# Patient Record
Sex: Male | Born: 1980 | Race: White | Hispanic: No | Marital: Married | Smoking: Current every day smoker
Health system: Southern US, Community
[De-identification: ages and names within clinical notes are randomized; demographics above are authoritative.]

## PROBLEM LIST (undated history)

## (undated) DIAGNOSIS — K219 Gastro-esophageal reflux disease without esophagitis: Secondary | ICD-10-CM

## (undated) DIAGNOSIS — L511 Stevens-Johnson syndrome: Secondary | ICD-10-CM

## (undated) HISTORY — DX: Gastro-esophageal reflux disease without esophagitis: K21.9

## (undated) HISTORY — PX: OTHER SURGICAL HISTORY: SHX169

## (undated) HISTORY — DX: Stevens-Johnson syndrome: L51.1

---
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
## 2004-12-31 ENCOUNTER — Ambulatory Visit: Payer: Self-pay | Admitting: Family Medicine

## 2006-03-11 ENCOUNTER — Emergency Department (HOSPITAL_COMMUNITY): Admission: EM | Admit: 2006-03-11 | Discharge: 2006-03-11 | Payer: Self-pay | Admitting: Emergency Medicine

## 2007-07-07 IMAGING — CR DG SINUSES COMPLETE 3+V
4 series · 4 of 4 positions shown · non-contrast
Comparison: None

CLINICAL DATA: sinus infection. Dizziness. Tenderness.

Sinuses, 4 view:

[[person_name]]
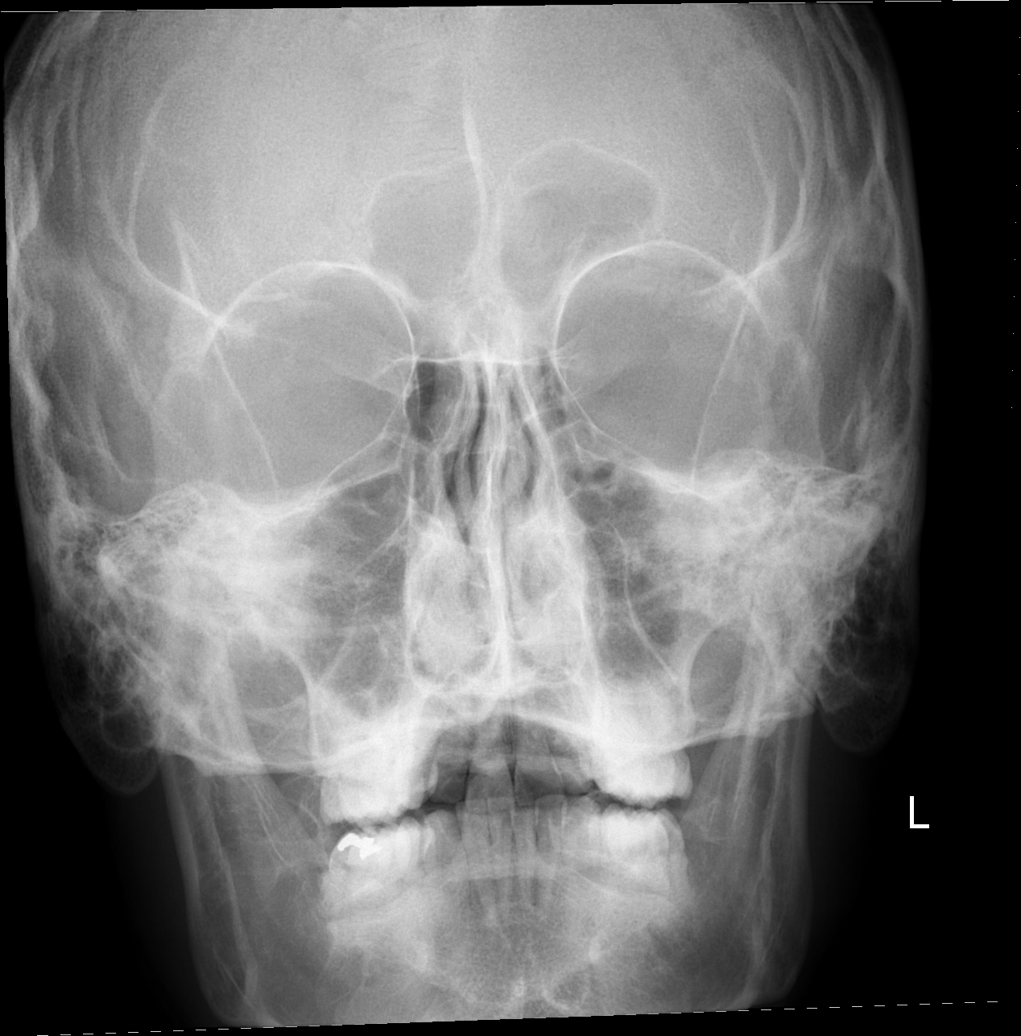

[w waters]
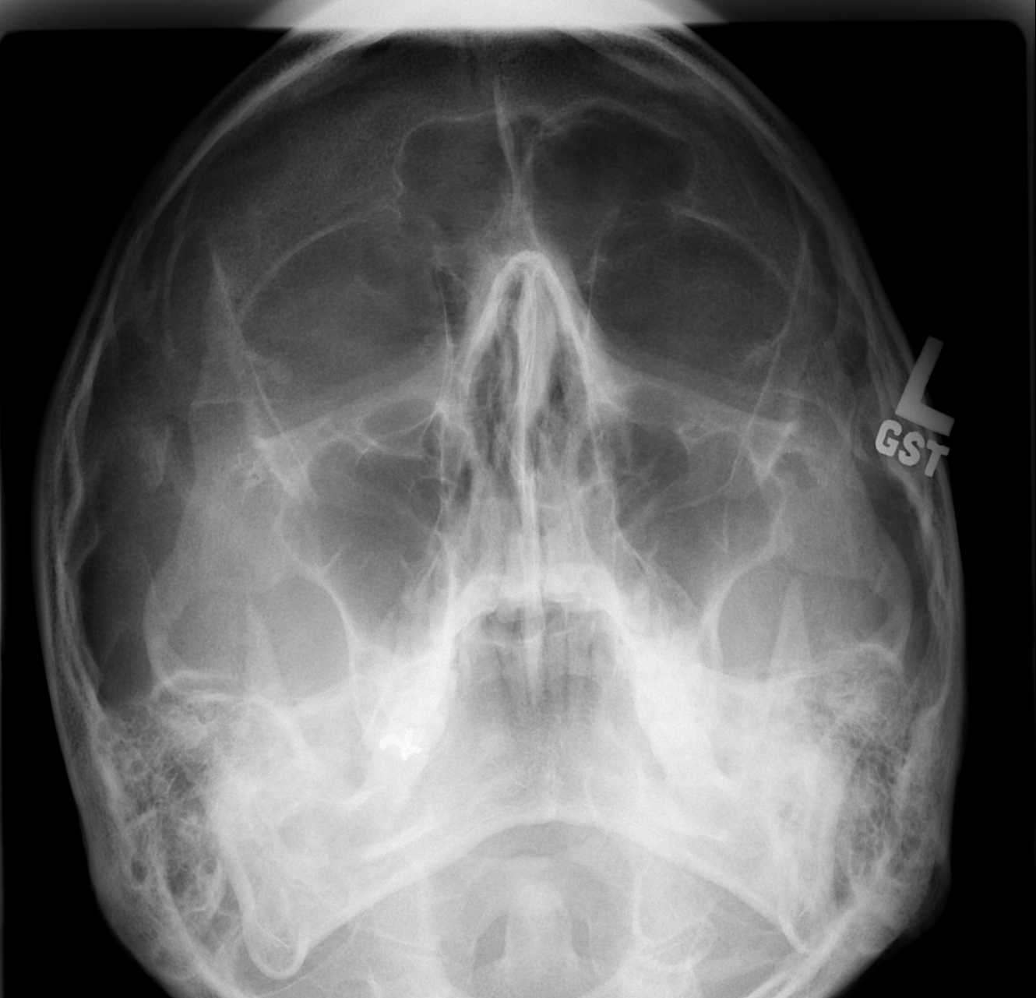

[w skull lat]
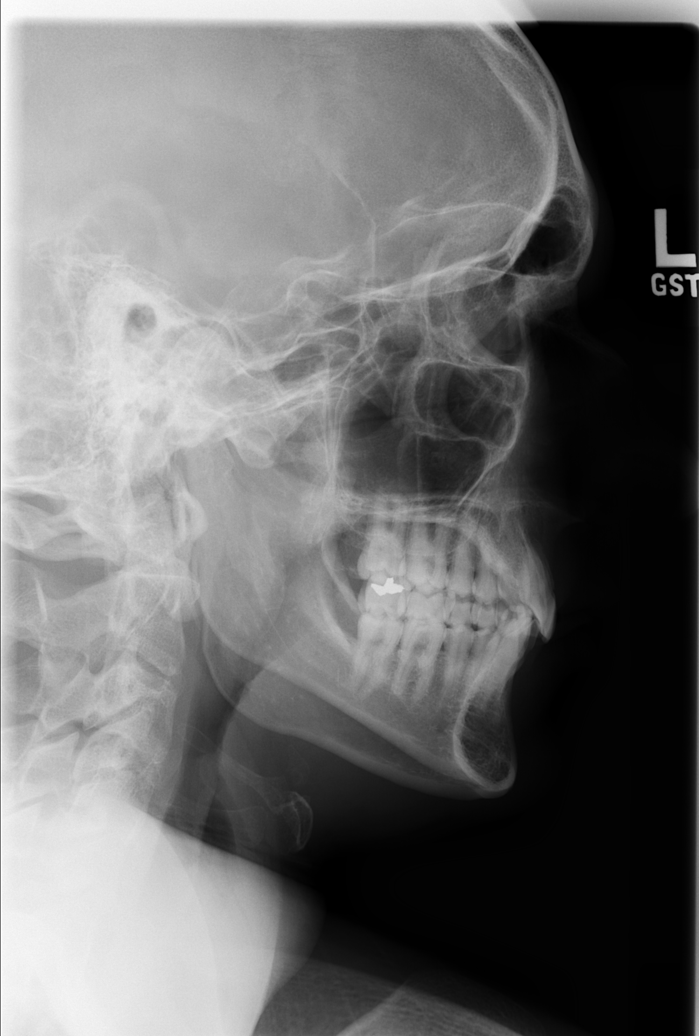

[w smv]
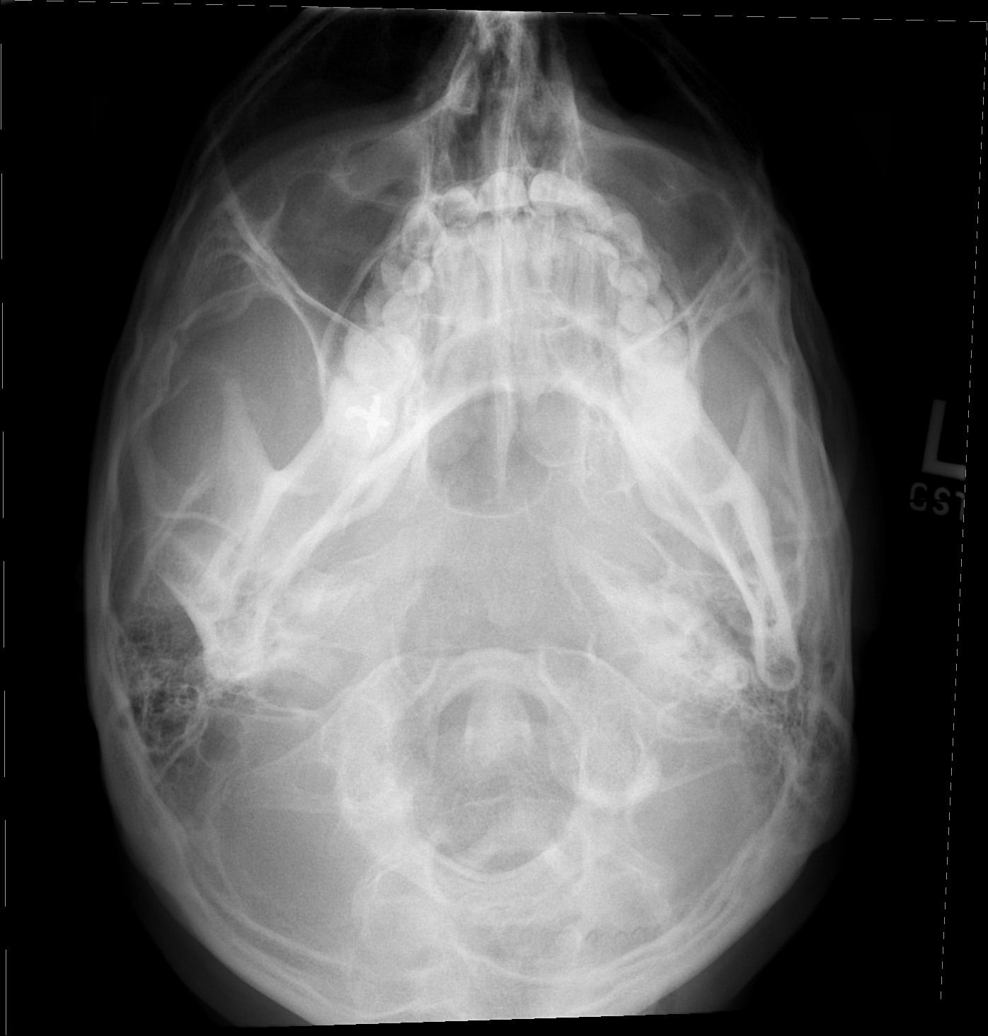

[4 of 4 positions shown; findings below may reference images not displayed]

FINDINGS: No definite paranasal sinus opacification is identified. CT scan is
more sensitive in detecting chronic sinusitis. No air-fluid level noted.

Impressions:
1. No sinusitis detected. CT scan offers greater diagnostic sensitivity.

## 2014-06-06 ENCOUNTER — Encounter: Payer: Self-pay | Admitting: Internal Medicine

## 2014-06-06 ENCOUNTER — Ambulatory Visit (INDEPENDENT_AMBULATORY_CARE_PROVIDER_SITE_OTHER): Payer: PRIVATE HEALTH INSURANCE | Admitting: Internal Medicine

## 2014-06-06 VITALS — BP 134/89 | HR 78 | Temp 97.8°F | Ht 72.0 in | Wt 195.0 lb

## 2014-06-06 DIAGNOSIS — L03113 Cellulitis of right upper limb: Secondary | ICD-10-CM | POA: Insufficient documentation

## 2014-06-06 MED ORDER — DOXYCYCLINE HYCLATE 100 MG PO TABS: 100.0000 mg | ORAL_TABLET | Freq: Two times a day (BID) | ORAL | Status: DC

## 2014-06-06 NOTE — Assessment & Plan Note (Addendum)
His cellulitis seems to be resolving after lancing it though with the pus it may be MRSA. Particularly with the significant lymphadenopathy. I will try a course of doxycycline to see if that it's the lymphadenopathy to start to resolve and see him in about 2 weeks. If his lymphadenopathy persists, or worsens, I may consider biopsy. Also will check HIV

## 2014-06-06 NOTE — Progress Notes (Signed)
   Subjective:    Patient ID: Chad Bray, male    DOB: Jan 06, 1981, 34 y.o.   MRN: [id number]  HPI This is a new pt referred for cellulitis.  Had right finger pain after foreign body into index finger from a broken ornament.  This occurred around 04/23/14.  Was seen by orthopedics on 05/19/14 and noted cellulitis.  Had xray with no foreign body.  No fever, no chills.  Started initially on 1/3 with Keflex TID for 10 days but did not resolve.  Noted axillary lymph nodes.  Saw ortho and continued Keflex at [facility].  It then developed into an abscess and he lanced it himself. White pus came out. It is now closed and healed. He will continues to have the right axillary lymph nodes which is been by his description stable in size and not decreasing, painful.   Review of Systems  Constitutional: Negative for fever, chills and fatigue.       Had fever but is now resolved, subjective  HENT: Negative for trouble swallowing.   Gastrointestinal: Negative for nausea and diarrhea.  Skin: Negative for rash.  Neurological: Negative for dizziness, light-headedness and headaches.       Objective:   Physical Exam  Constitutional: He appears well-developed and well-nourished. No distress.  Eyes: No scleral icterus.  Lymphadenopathy:    He has no cervical adenopathy.    He has axillary adenopathy.  He has about 1 cm axillary lymph node that is tender and fairly movable. No supra clavicular lymph nodes.          Assessment & Plan:

## 2014-06-07 ENCOUNTER — Telehealth: Payer: Self-pay | Admitting: *Deleted

## 2014-06-07 LAB — HIV ANTIBODY (ROUTINE TESTING W REFLEX): HIV 1&2 Ab, 4th Generation: NONREACTIVE

## 2014-06-07 NOTE — Telephone Encounter (Signed)
Pt notified. He will pick up doxycycline and start this today.  Andree CossHowell, Lenee Franze M, RN

## 2014-06-07 NOTE — Telephone Encounter (Signed)
-----   Message from Gardiner Barefootobert W Comer, MD sent at 06/07/2014 10:51 AM EST ----- Please let him know that his HIV test was negative.  thanks

## 2014-06-20 ENCOUNTER — Ambulatory Visit (INDEPENDENT_AMBULATORY_CARE_PROVIDER_SITE_OTHER): Payer: PRIVATE HEALTH INSURANCE | Admitting: Internal Medicine

## 2014-06-20 ENCOUNTER — Encounter: Payer: Self-pay | Admitting: Internal Medicine

## 2014-06-20 VITALS — BP 145/96 | HR 74 | Temp 98.4°F | Ht 72.0 in | Wt 194.0 lb

## 2014-06-20 DIAGNOSIS — L03113 Cellulitis of right upper limb: Secondary | ICD-10-CM

## 2014-06-20 NOTE — Assessment & Plan Note (Signed)
Cellulitis now resolved and his lymphadenopathy has nearly resolved. No indication for further antibiotics. He can return on a when necessary basis.

## 2014-06-20 NOTE — Progress Notes (Signed)
   Subjective:    Patient ID: Chad Bray, male    DOB: October 22, 1980, 34 y.o.   MRN: [id number]  HPI This is a follow-up for cellulitis.  Had right finger pain after foreign body into index finger from a broken ornament.  This occurred around 04/23/14.  Was seen by orthopedics on 05/19/14 and noted cellulitis.  Had xray with no foreign body.  No fever, no chills.  Started initially on 1/3 with Keflex TID for 10 days but did not resolve.  Noted axillary lymph nodes.  Saw ortho and continued Keflex at QID.  It then developed into an abscess and he lanced it himself. White pus came out. It then closed and healed. He continued to have the right axillary lymph nodes which is been by his description stable in size and not decreasing, painful when I saw him 2 weeks ago.  I then started him on doxycycline to take for 2 weeks. His lymphadenopathy now has nearly completely resolved. His right arm movement is now much improved and he feels much better.   Review of Systems  Constitutional: Negative for fever, chills and fatigue.  HENT: Negative for trouble swallowing.   Gastrointestinal: Negative for nausea and diarrhea.  Skin: Negative for rash.  Neurological: Negative for dizziness, light-headedness and headaches.       Objective:   Physical Exam  Constitutional: He appears well-developed and well-nourished. No distress.  Eyes: No scleral icterus.  Lymphadenopathy:    He has no cervical adenopathy.    He has axillary adenopathy.  He has about 2-3 mm axillary lymph node that is minimally tender, much improved.          Assessment & Plan:

## 2022-11-23 ENCOUNTER — Inpatient Hospital Stay (HOSPITAL_COMMUNITY)
Admission: EM | Admit: 2022-11-23 | Discharge: 2022-11-26 | DRG: 481 | Disposition: A | Discharge status: Home / Self Care | Payer: 59 | Attending: Orthopedic Surgery | Admitting: Orthopedic Surgery

## 2022-11-23 ENCOUNTER — Emergency Department (HOSPITAL_COMMUNITY): Payer: 59

## 2022-11-23 ENCOUNTER — Other Ambulatory Visit: Payer: Self-pay

## 2022-11-23 ENCOUNTER — Encounter (HOSPITAL_COMMUNITY): Payer: Self-pay

## 2022-11-23 DIAGNOSIS — Z7985 Long-term (current) use of injectable non-insulin antidiabetic drugs: Secondary | ICD-10-CM

## 2022-11-23 DIAGNOSIS — Z888 Allergy status to other drugs, medicaments and biological substances status: Secondary | ICD-10-CM

## 2022-11-23 DIAGNOSIS — M62838 Other muscle spasm: Secondary | ICD-10-CM | POA: Diagnosis present

## 2022-11-23 DIAGNOSIS — W1789XA Other fall from one level to another, initial encounter: Secondary | ICD-10-CM | POA: Diagnosis present

## 2022-11-23 DIAGNOSIS — E876 Hypokalemia: Secondary | ICD-10-CM | POA: Diagnosis present

## 2022-11-23 DIAGNOSIS — F17210 Nicotine dependence, cigarettes, uncomplicated: Secondary | ICD-10-CM | POA: Diagnosis present

## 2022-11-23 DIAGNOSIS — M25461 Effusion, right knee: Secondary | ICD-10-CM | POA: Diagnosis present

## 2022-11-23 DIAGNOSIS — S72301A Unspecified fracture of shaft of right femur, initial encounter for closed fracture: Secondary | ICD-10-CM | POA: Diagnosis present

## 2022-11-23 DIAGNOSIS — E871 Hypo-osmolality and hyponatremia: Secondary | ICD-10-CM | POA: Diagnosis not present

## 2022-11-23 DIAGNOSIS — D62 Acute posthemorrhagic anemia: Secondary | ICD-10-CM | POA: Diagnosis not present

## 2022-11-23 DIAGNOSIS — S72141A Displaced intertrochanteric fracture of right femur, initial encounter for closed fracture: Principal | ICD-10-CM | POA: Diagnosis present

## 2022-11-23 DIAGNOSIS — M898X9 Other specified disorders of bone, unspecified site: Secondary | ICD-10-CM | POA: Diagnosis present

## 2022-11-23 DIAGNOSIS — K219 Gastro-esophageal reflux disease without esophagitis: Secondary | ICD-10-CM | POA: Diagnosis present

## 2022-11-23 DIAGNOSIS — S72001A Fracture of unspecified part of neck of right femur, initial encounter for closed fracture: Secondary | ICD-10-CM | POA: Diagnosis not present

## 2022-11-23 LAB — BASIC METABOLIC PANEL
Anion gap: 14 (ref 5–15)
BUN: 14 mg/dL (ref 6–20)
CO2: 23 mmol/L (ref 22–32)
Calcium: 9.2 mg/dL (ref 8.9–10.3)
Chloride: 100 mmol/L (ref 98–111)
Creatinine, Ser: 1.02 mg/dL (ref 0.61–1.24)
GFR, Estimated: >60 mL/min (ref >60)
Glucose, Bld: 120 mg/dL — ABNORMAL HIGH (ref 70–99)
Potassium: 3.1 mmol/L — ABNORMAL LOW (ref 3.5–5.1)
Sodium: 137 mmol/L (ref 135–145)

## 2022-11-23 LAB — SURGICAL PCR SCREEN
MRSA, PCR: NEGATIVE
Staphylococcus aureus: NEGATIVE

## 2022-11-23 LAB — CBC
HCT: 43.5 % (ref 39.0–52.0)
Hemoglobin: 15.1 g/dL (ref 13.0–17.0)
MCH: 32.3 pg (ref 26.0–34.0)
MCHC: 34.7 g/dL (ref 30.0–36.0)
MCV: 92.9 fL (ref 80.0–100.0)
Platelets: 214 10*3/uL (ref 150–400)
RBC: 4.68 MIL/uL (ref 4.22–5.81)
RDW: 12.3 % (ref 11.5–15.5)
WBC: 8.7 10*3/uL (ref 4.0–10.5)
nRBC: 0 % (ref 0.0–0.2)

## 2022-11-23 LAB — HIV ANTIBODY (ROUTINE TESTING W REFLEX): HIV Screen 4th Generation wRfx: NONREACTIVE

## 2022-11-23 LAB — POTASSIUM: Potassium: 4 mmol/L (ref 3.5–5.1)

## 2022-11-23 MED ORDER — ACETAMINOPHEN 650 MG RE SUPP: 650.0000 mg | Freq: Four times a day (QID) | RECTAL | Status: DC | PRN

## 2022-11-23 MED ORDER — POTASSIUM CHLORIDE CRYS ER 20 MEQ PO TBCR
40.0000 meq | EXTENDED_RELEASE_TABLET | ORAL | Status: AC
  Administered 2022-11-23 (×2): 40 meq via ORAL
  Filled 2022-11-23 (×2): qty 2

## 2022-11-23 MED ORDER — HYDROMORPHONE HCL 1 MG/ML IJ SOLN
1.0000 mg | INTRAMUSCULAR | Status: DC | PRN
  Administered 2022-11-23 – 2022-11-25 (×17): 1 mg via INTRAVENOUS
  Filled 2022-11-23 (×17): qty 1

## 2022-11-23 MED ORDER — ONDANSETRON HCL 4 MG PO TABS: 4.0000 mg | ORAL_TABLET | Freq: Four times a day (QID) | ORAL | Status: DC | PRN

## 2022-11-23 MED ORDER — ENOXAPARIN SODIUM 40 MG/0.4ML IJ SOSY
40.0000 mg | PREFILLED_SYRINGE | Freq: Once | INTRAMUSCULAR | Status: AC
  Administered 2022-11-23: 40 mg via SUBCUTANEOUS

## 2022-11-23 MED ORDER — ENOXAPARIN SODIUM 40 MG/0.4ML IJ SOSY: 40.0000 mg | PREFILLED_SYRINGE | INTRAMUSCULAR | Status: DC

## 2022-11-23 MED ORDER — DIAZEPAM 5 MG/ML IJ SOLN
10.0000 mg | INTRAMUSCULAR | Status: DC | PRN
  Administered 2022-11-23: 10 mg via INTRAVENOUS
  Filled 2022-11-23: qty 2

## 2022-11-23 MED ORDER — HYDROMORPHONE HCL 1 MG/ML IJ SOLN
1.0000 mg | Freq: Once | INTRAMUSCULAR | Status: AC
  Administered 2022-11-23: 1 mg via INTRAVENOUS
  Filled 2022-11-23: qty 1

## 2022-11-23 MED ORDER — FENTANYL CITRATE PF 50 MCG/ML IJ SOSY
50.0000 ug | PREFILLED_SYRINGE | Freq: Once | INTRAMUSCULAR | Status: AC
  Administered 2022-11-23: 50 ug via INTRAVENOUS
  Filled 2022-11-23: qty 1

## 2022-11-23 MED ORDER — ONDANSETRON HCL 4 MG/2ML IJ SOLN: 4.0000 mg | Freq: Four times a day (QID) | INTRAMUSCULAR | Status: DC | PRN

## 2022-11-23 MED ORDER — ACETAMINOPHEN 325 MG PO TABS: 650.0000 mg | ORAL_TABLET | Freq: Four times a day (QID) | ORAL | Status: DC | PRN

## 2022-11-23 MED ORDER — OXYCODONE HCL 5 MG PO TABS
10.0000 mg | ORAL_TABLET | ORAL | Status: DC | PRN
  Administered 2022-11-23 – 2022-11-25 (×4): 10 mg via ORAL
  Filled 2022-11-23 (×5): qty 2

## 2022-11-23 MED ORDER — LACTATED RINGERS IV SOLN: INTRAVENOUS | Status: DC

## 2022-11-23 MED ORDER — METHOCARBAMOL 1000 MG/10ML IJ SOLN
500.0000 mg | Freq: Four times a day (QID) | INTRAVENOUS | Status: DC | PRN
  Administered 2022-11-23 (×2): 500 mg via INTRAVENOUS
  Filled 2022-11-23 (×2): qty 500

## 2022-11-23 NOTE — Consult Note (Signed)
ORTHOPAEDIC H and P  REQUESTING PHYSICIAN: Meredeth Ide, MD  PCP:  Patient, No Pcp Per  Chief Complaint: Right hip pain  HPI: Chad Bray is a 42 y.o. male who complains of right hip pain and deformity of the leg following a fall yesterday evening.  He was attempting to hop over a fence to greet a friend when he got his foot caught on the rail and landed on his right hip.  He had immediate pain and deformity.  He was assisted to his feet by some friends.  He knew that he could not apply much weight or pressure so presented to the ER where x-rays did show hip fracture.  This was at about 330 this morning.  Orthopedic surgery called for consultation.  [facility] is a established patient of [facility] thus we were consulted.  Denies numbness or tingling.  No history of previous surgery or medical morbidity.  He does smoke tobacco.  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  Past Medical History:  Diagnosis Date   GERD (gastroesophageal reflux disease)    Stevens-Johnson syndrome (HCC)    lamictal   History reviewed. No pertinent surgical history. Social History   Socioeconomic History   Marital status: Married    Spouse name: Not on file   Number of children: Not on file   Years of education: Not on file   Highest education level: Not on file  Occupational History   Not on file  Tobacco Use   Smoking status: Every Day    Current packs/day: 0.10    Average packs/day: 0.1 packs/day for 10.0 years (1.0 ttl pk-yrs)    Types: Cigarettes   Smokeless tobacco: Never  Substance and Sexual Activity   Alcohol use: Yes    Alcohol/week: 5.0 standard drinks of alcohol    Types: 5 Standard drinks or equivalent per week    Comment: social   Drug use: No   Sexual activity: Yes    Partners: Female  Other Topics Concern   Not on file  Social History Narrative   Not on file   Social Determinants of Health   Financial Resource Strain: Not on file  Food  Insecurity: Not on file  Transportation Needs: Not on file  Physical Activity: Not on file  Stress: Not on file  Social Connections: Not on file   History reviewed. No pertinent family history. Allergies  Allergen Reactions   Lamictal [Lamotrigine] Other (See Comments)    Levonne Spiller Syndrome, 2014   Prior to Admission medications   Medication Sig Start Date End Date Taking? Authorizing Provider  tirzepatide (ZEPBOUND) 2.5 MG/0.5ML Pen Inject 2.5 mg into the skin once a week. Patient not taking: Reported on 11/23/2022 07/10/22   [provider]   DG Femur Min 2 Views Right  Result Date: 11/23/2022 CLINICAL DATA:  Fall, landing on right hip. EXAM: RIGHT FEMUR 2 VIEWS COMPARISON:  None Available. FINDINGS: There are is a comminuted fracture involving the greater and lesser trochanters with extension into the proximal femoral diaphysis with medial displacement of the distal fracture fragment. No dislocation. The soft tissues are within normal limits. IMPRESSION: Comminuted intertrochanteric fracture extending into the proximal femoral diaphysis with medial displacement of the distal fracture fragment. Electronically Signed   By: Thornell Sartorius M.D.   On: 11/23/2022 02:23   DG Pelvis Portable  Result Date: 11/23/2022 CLINICAL DATA:  Fall, right hip pain EXAM: PORTABLE PELVIS 1-2 VIEWS COMPARISON:  None Available. FINDINGS: An acute minimally displaced intratrochanteric right hip fracture is seen with fracture fragments in grossly anatomic alignment on this limited examination. Pelvis and visualized proximal left femur appear intact. Soft tissues are unremarkable. IMPRESSION: 1. Acute minimally displaced intratrochanteric right hip fracture. Electronically Signed   By: Helyn Numbers M.D.   On: 11/23/2022 02:19    Positive ROS: All other systems have been reviewed and were otherwise negative with the exception of those mentioned in the HPI and as above.  Physical Exam: General: Alert,  no acute distress Cardiovascular: No pedal edema Respiratory: No cyanosis, no use of accessory musculature GI: No organomegaly, abdomen is soft and non-tender Skin: No lesions in the area of chief complaint Neurologic: Sensation intact distally Psychiatric: Patient is competent for consent with normal mood and affect Lymphatic: No axillary or cervical lymphadenopathy  MUSCULOSKELETAL:  Right lower extremity is flexed at the hip and knee and resting on a pillow.  Neurovascular intact.  Assessment: Right hip intertrochanteric fracture with subtrochanteric spiral extension  Plan: -Plan will be to place in Buck's traction today for comfort.  He can have a diet today.  Will need to be n.p.o. tonight at midnight.  -Will administer a one-time only dose of Lovenox today.  -Dr. Linna Caprice is consulted with Dr. Carola Frost and his team for operative management tomorrow on August 5.  I have conveyed that to the patient.  -Appreciate hospitalist service for medical management.  They are currently replating his hypokalemia.  -Otherwise, appreciate Dr. Carola Frost for definitive orthopedic trauma care.    Yolonda Kida, MD Cell [phone]    11/23/2022 9:37 AM

## 2022-11-23 NOTE — Plan of Care (Signed)

## 2022-11-23 NOTE — Progress Notes (Signed)
Consult request received for right femur fracture. Have discussed with the requesting MD patient's stability, pain control, and ongoing work up. I have reviewed x-rays and developed a provisional plan.   Full consultation to follow in the am.  Will obtain CT scan at some point before surgery tomorrow to assist with surgical planning given spiral pattern and comminution which involves the greater trochanter and may extend into the piriformis fossa also.  Myrene Galas, MD [facility], [facility] [phone]

## 2022-11-23 NOTE — H&P (Addendum)
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  Felt immediate pain in his right hip.  Brought to the ER.  X-rays show comminuted intertrochanteric fracture with medial displacement of the distal fragment.  EDP is consulted orthopedics.  [facility] hospitalist contacted for admission.  Patient denies any head trauma.  No chronic medical issues.    ED Course: xray show right intertrochanteric fracture.   Review of Systems:  Review of Systems  Constitutional: Negative.   HENT: Negative.    Eyes: Negative.   Respiratory: Negative.    Cardiovascular: Negative.   Gastrointestinal: Negative.   Genitourinary: Negative.   Musculoskeletal:        Right Hip pain  Skin: Negative.   Neurological: Negative.   Endo/Heme/Allergies: Negative.   Psychiatric/Behavioral: Negative.    All other systems reviewed and are negative.   Past Medical History:  Diagnosis Date   GERD (gastroesophageal reflux disease)    Stevens-Johnson syndrome (HCC)    lamictal    History reviewed. No pertinent surgical history.   reports that he has been smoking cigarettes. He has a 1 pack-year smoking history. He has never used smokeless tobacco. He reports current alcohol use of about 5.0 standard drinks of alcohol per week. He reports that he does not use drugs.  Allergies  Allergen Reactions   Lamictal [Lamotrigine] Other (See Comments)    Levonne Spiller Syndrome, 2014    History reviewed. No pertinent family history.  Prior to Admission medications   Medication Sig Start Date End Date Taking? Authorizing Provider  tirzepatide (ZEPBOUND) 2.5 MG/0.5ML Pen  Inject 2.5 mg into the skin once a week. Patient not taking: Reported on 11/23/2022 07/10/22   [provider]    Physical Exam: Vitals:   11/23/22 0200 11/23/22 0215 11/23/22 0230 11/23/22 0300  BP: 122/83 126/79 116/74 127/82  Pulse: 80 85 71 98  Resp: 20 13 13 14   Temp:      TempSrc:      SpO2: 100% 98% 100% 100%  Weight:      Height:        Physical Exam Vitals and nursing note reviewed.  Constitutional:      General: He is not in acute distress.    Appearance: Normal appearance. He is not toxic-appearing or diaphoretic.  HENT:     Head: Normocephalic and atraumatic.  Cardiovascular:     Rate and Rhythm: Normal rate and regular rhythm.     Pulses: Normal pulses.  Pulmonary:     Effort: Pulmonary effort is normal.     Breath sounds: Normal breath sounds.  Abdominal:     General: Abdomen is flat. Bowel sounds are normal. There is no distension.  Musculoskeletal:     Comments: Right LE shortened and externally rotated.  Skin:    General: Skin is warm and dry.     Capillary Refill: Capillary refill takes less than 2 seconds.  Neurological:     General: No focal deficit present.     Mental Status: He is alert and oriented to person, place, and time.      Labs on Admission: I have  personally reviewed following labs and imaging studies  CBC: Recent Labs  Lab 11/23/22 0157  WBC 8.7  HGB 15.1  HCT 43.5  MCV 92.9  PLT 214   Basic Metabolic Panel: Recent Labs  Lab 11/23/22 0157  NA 137  K 3.1*  CL 100  CO2 23  GLUCOSE 120*  BUN 14  CREATININE 1.02  CALCIUM 9.2   GFR: Estimated Creatinine Clearance: 103.6 mL/min (by C-G formula based on SCr of 1.02 mg/dL).  Radiological Exams on Admission: I have personally reviewed images DG Femur Min 2 Views Right  Result Date: 11/23/2022 CLINICAL DATA:  Fall, landing on right hip. EXAM: RIGHT FEMUR 2 VIEWS COMPARISON:  None Available. FINDINGS: There are is a comminuted fracture involving the greater and  lesser trochanters with extension into the proximal femoral diaphysis with medial displacement of the distal fracture fragment. No dislocation. The soft tissues are within normal limits. IMPRESSION: Comminuted intertrochanteric fracture extending into the proximal femoral diaphysis with medial displacement of the distal fracture fragment. Electronically Signed   By: Thornell Sartorius M.D.   On: 11/23/2022 02:23   DG Pelvis Portable  Result Date: 11/23/2022 CLINICAL DATA:  Fall, right hip pain EXAM: PORTABLE PELVIS 1-2 VIEWS COMPARISON:  None Available. FINDINGS: An acute minimally displaced intratrochanteric right hip fracture is seen with fracture fragments in grossly anatomic alignment on this limited examination. Pelvis and visualized proximal left femur appear intact. Soft tissues are unremarkable. IMPRESSION: 1. Acute minimally displaced intratrochanteric right hip fracture. Electronically Signed   By: Helyn Numbers M.D.   On: 11/23/2022 02:19    EKG: My personal interpretation of EKG shows: no EKG  Assessment/Plan Principal Problem:   Closed intertrochanteric fracture of hip, right, initial encounter [facility]) Active Problems:   Hypokalemia    Assessment and Plan: * Closed intertrochanteric fracture of hip, right, initial encounter (HCC) Admit to inpatient. EDP has consulted orthopedics([facility]). Keep NPO. On IVF. Prn IV dilaudid 1 mg or oxycodone 10 mg. Prn IV valium for muscle spasm. Pt without any other health issues.  Hypokalemia Replete with oral kcl   DVT prophylaxis: SCDs Code Status: Full Code Family Communication: no family at bedside  Disposition Plan: return home  Consults called: EDP has consulted orthopedics([facility])  Admission status: Inpatient, Med-Surg   Carollee Herter, DO [facility] 11/23/2022, 3:39 AM

## 2022-11-23 NOTE — Progress Notes (Signed)
Orthopedic Tech Progress Note Patient Details:  ELDRICK PENICK [birth year] [id number]  15 lbs of Bucks traction applied to RLE. Sandbags are not close to/touching the ground at this time.   Musculoskeletal Traction Type of Traction: Bucks Skin Traction Traction Location: RLE Traction Weight: 15 lbs   Post Interventions Patient Tolerated: Fair Instructions Provided: Care of device  Sherra Kimmons Carmine Savoy 11/23/2022, 10:40 AM

## 2022-11-23 NOTE — ED Triage Notes (Signed)
Pt arrives with c/o right leg injury that happened tonight. Per pt, he was jumping over a fence and landed on his right hip. Pt endorses right hip pain.

## 2022-11-23 NOTE — Assessment & Plan Note (Signed)
Admit to inpatient. EDP has consulted orthopedics([facility]). Keep NPO. On IVF. Prn IV dilaudid 1 mg or oxycodone 10 mg. Prn IV valium for muscle spasm. Pt without any other health issues.

## 2022-11-23 NOTE — Assessment & Plan Note (Signed)
Replete with oral kcl 

## 2022-11-23 NOTE — ED Provider Notes (Signed)
[facility]PT [facility] Emergency Department Provider Note MRN:  [id number]  Arrival date & time: 11/23/22     Chief Complaint   Leg Injury   History of Present Illness   Chad Bray is a 42 y.o. year-old male with no pertinent past medical history presenting to the ED with chief complaint of leg injury.  Climbed a fence and then fell off of it onto the concrete.  Severe pain to the right hip/femur area.  No head trauma, no other complaints.  Review of Systems  A thorough review of systems was obtained and all systems are negative except as noted in the HPI and PMH.   Patient's Health History    Past Medical History:  Diagnosis Date   GERD (gastroesophageal reflux disease)    Stevens-Johnson syndrome (HCC)    lamictal    History reviewed. No pertinent surgical history.  History reviewed. No pertinent family history.  Social History   Socioeconomic History   Marital status: Married    Spouse name: Not on file   Number of children: Not on file   Years of education: Not on file   Highest education level: Not on file  Occupational History   Not on file  Tobacco Use   Smoking status: Every Day    Current packs/day: 0.10    Average packs/day: 0.1 packs/day for 10.0 years (1.0 ttl pk-yrs)    Types: Cigarettes   Smokeless tobacco: Never  Substance and Sexual Activity   Alcohol use: Yes    Alcohol/week: 0.0 standard drinks of alcohol    Comment: social   Drug use: No   Sexual activity: Yes    Partners: Female  Other Topics Concern   Not on file  Social History Narrative   Not on file   Social Determinants of Health   Financial Resource Strain: Not on file  Food Insecurity: Not on file  Transportation Needs: Not on file  Physical Activity: Not on file  Stress: Not on file  Social Connections: Not on file  Intimate Partner Violence: Not on file     Physical Exam   Vitals:   11/23/22 0150 11/23/22 0200  BP: 129/72 122/83  Pulse: 71 80   Resp: 14 20  Temp: 97.9 F (36.6 C)   SpO2: 98% 100%    CONSTITUTIONAL: Well-appearing, NAD NEURO/PSYCH:  Alert and oriented x 3, no focal deficits EYES:  eyes equal and reactive ENT/NECK:  no LAD, no JVD CARDIO: Regular rate, well-perfused, normal S1 and S2 PULM:  CTAB no wheezing or rhonchi GI/GU:  non-distended, non-tender MSK/SPINE:  No gross deformities, no edema SKIN:  no rash, atraumatic   *Additional and/or pertinent findings included in MDM below  Diagnostic and Interventional Summary    EKG Interpretation Date/Time:    Ventricular Rate:    PR Interval:    QRS Duration:    QT Interval:    QTC Calculation:   R Axis:      Text Interpretation:         Labs Reviewed  CBC  BASIC METABOLIC PANEL    DG Femur Min 2 Views Right  Final Result    DG Pelvis Portable  Final Result      Medications  HYDROmorphone (DILAUDID) injection 1 mg (1 mg Intravenous Given 11/23/22 0135)     Procedures  /  Critical Care Procedures  ED Course and Medical Decision Making  Initial Impression and Ddx Fall off of a 5 foot fence here with isolated right  hip/femur pain.  Unable to bear weight, highly suspicious for fracture.  The leg is neurovascularly intact.  No other signs of trauma.  Past medical/surgical history that increases complexity of ED encounter: None  Interpretation of Diagnostics I personally reviewed the femur x-ray and my interpretation is as follows: Displaced femur fracture    Patient Reassessment and Ultimate Disposition/Management     Case discussed with [facility] on-call who is on board for management.  Will admit to medicine.  Patient management required discussion with the following services or consulting groups:  Hospitalist Service and Orthopedic Surgery  Complexity of Problems Addressed Acute illness or injury that poses threat of life of bodily function  Additional Data Reviewed and Analyzed Further history obtained  from: None  Additional Factors Impacting ED Encounter Risk Consideration of hospitalization  Elmer Sow. Pilar Plate, MD [facility] Emergency Medicine [facility] [email or web address]  Final Clinical Impressions(s) / ED Diagnoses     ICD-10-CM   1. Closed displaced intertrochanteric fracture of right femur, initial encounter [facility])  E45.409W       ED Discharge Orders     None        Discharge Instructions Discussed with and Provided to Patient:   Discharge Instructions   None      Sabas Sous, MD 11/23/22 [phone]

## 2022-11-23 NOTE — ED Notes (Signed)
ED TO INPATIENT HANDOFF REPORT  ED Nurse Name and Phone #: Murlean Iba PM [phone]  S Name/Age/Gender Chad Bray 42 y.o. male Room/Bed: 030C/030C  Code Status   Code Status: Full Code  Home/SNF/Other Home Patient oriented to: self, place, time, and situation Is this baseline? Yes   Triage Complete: Triage complete  Chief Complaint Closed intertrochanteric fracture of hip, right, initial encounter [facility]) [S72.141A]  Triage Note Pt arrives with c/o right leg injury that happened tonight. Per pt, he was jumping over a fence and landed on his right hip. Pt endorses right hip pain.    Allergies Allergies  Allergen Reactions   Lamictal [Lamotrigine] Other (See Comments)    Levonne Spiller Syndrome, 2014    Level of Care/Admitting Diagnosis ED Disposition     ED Disposition  Admit   Condition  --   Comment  Hospital Area: [facility] [[id number]]  Level of Care: Med-Surg [16]  May admit patient to [facility] or [facility] if equivalent level of care is available:: No  Covid Evaluation: Asymptomatic - no recent exposure (last 10 days) testing not required  Diagnosis: Closed intertrochanteric fracture of hip, right, initial encounter [facility]) [[id number]]  Admitting Physician: Imogene Burn ERIC [[id number]]  Attending Physician: Imogene Burn, ERIC [[id number]]  Certification:: I certify this patient will need inpatient services for at least 2 midnights  Estimated Length of Stay: 2          B Medical/Surgery History Past Medical History:  Diagnosis Date   GERD (gastroesophageal reflux disease)    Stevens-Johnson syndrome (HCC)    lamictal   History reviewed. No pertinent surgical history.   A IV Location/Drains/Wounds Patient Lines/Drains/Airways Status     Active Line/Drains/Airways     Name Placement date Placement time Site Days   Peripheral IV 11/23/22 20 G 1" Left;Posterior Forearm 11/23/22  0134  Forearm  less than 1            Intake/Output Last 24 hours No  intake or output data in the 24 hours ending 11/23/22 0412  Labs/Imaging Results for orders placed or performed during the hospital encounter of 11/23/22 (from the past 48 hour(s))  CBC     Status: None   Collection Time: 11/23/22  1:57 AM  Result Value Ref Range   WBC 8.7 4.0 - 10.5 K/uL   RBC 4.68 4.22 - 5.81 MIL/uL   Hemoglobin 15.1 13.0 - 17.0 g/dL   HCT 19.1 47.8 - 29.5 %   MCV 92.9 80.0 - 100.0 fL   MCH 32.3 26.0 - 34.0 pg   MCHC 34.7 30.0 - 36.0 g/dL   RDW 62.1 30.8 - 65.7 %   Platelets 214 150 - 400 K/uL   nRBC 0.0 0.0 - 0.2 %    Comment: Performed at [facility], [address] [place], [place] [address]  Basic metabolic panel     Status: Abnormal   Collection Time: 11/23/22  1:57 AM  Result Value Ref Range   Sodium 137 135 - 145 mmol/L   Potassium 3.1 (L) 3.5 - 5.1 mmol/L   Chloride 100 98 - 111 mmol/L   CO2 23 22 - 32 mmol/L   Glucose, Bld 120 (H) 70 - 99 mg/dL    Comment: Glucose reference range applies only to samples taken after fasting for at least 8 hours.   BUN 14 6 - 20 mg/dL   Creatinine, Ser 2.95 0.61 - 1.24 mg/dL   Calcium 9.2 8.9 - 10.3  mg/dL   GFR, Estimated >09 >60 mL/min    Comment: (NOTE) Calculated using the CKD-EPI Creatinine Equation (2021)    Anion gap 14 5 - 15    Comment: Performed at [facility], [address] [place], [place] [address]   DG Femur Min 2 Views Right  Result Date: 11/23/2022 CLINICAL DATA:  Fall, landing on right hip. EXAM: RIGHT FEMUR 2 VIEWS COMPARISON:  None Available. FINDINGS: There are is a comminuted fracture involving the greater and lesser trochanters with extension into the proximal femoral diaphysis with medial displacement of the distal fracture fragment. No dislocation. The soft tissues are within normal limits. IMPRESSION: Comminuted intertrochanteric fracture extending into the proximal femoral diaphysis with medial displacement of the distal fracture fragment. Electronically Signed   By: Thornell Sartorius M.D.   On: 11/23/2022 02:23   DG Pelvis Portable  Result Date: 11/23/2022 CLINICAL DATA:  Fall, right hip pain EXAM: PORTABLE PELVIS 1-2 VIEWS COMPARISON:  None Available. FINDINGS: An acute minimally displaced intratrochanteric right hip fracture is seen with fracture fragments in grossly anatomic alignment on this limited examination. Pelvis and visualized proximal left femur appear intact. Soft tissues are unremarkable. IMPRESSION: 1. Acute minimally displaced intratrochanteric right hip fracture. Electronically Signed   By: Helyn Numbers M.D.   On: 11/23/2022 02:19    Pending Labs Unresulted Labs (From admission, onward)     Start     Ordered   11/23/22 0357  HIV Antibody (routine testing w rflx)  (HIV Antibody (Routine testing w reflex) panel)  Once,   R        11/23/22 0356            Vitals/Pain Today's Vitals   11/23/22 0215 11/23/22 0230 11/23/22 0300 11/23/22 0317  BP: 126/79 116/74 127/82   Pulse: 85 71 98   Resp: 13 13 14    Temp:      TempSrc:      SpO2: 98% 100% 100%   Weight:      Height:      PainSc:    Asleep    Isolation Precautions No active isolations  Medications Medications  lactated ringers infusion ( Intravenous New Bag/Given 11/23/22 0248)  potassium chloride SA (KLOR-CON M) CR tablet 40 mEq (40 mEq Oral Given 11/23/22 0347)  enoxaparin (LOVENOX) injection 40 mg (has no administration in time range)  acetaminophen (TYLENOL) tablet 650 mg (has no administration in time range)    Or  acetaminophen (TYLENOL) suppository 650 mg (has no administration in time range)  ondansetron (ZOFRAN) tablet 4 mg (has no administration in time range)    Or  ondansetron (ZOFRAN) injection 4 mg (has no administration in time range)  oxyCODONE (Oxy IR/ROXICODONE) immediate release tablet 10 mg (has no administration in time range)    Or  HYDROmorphone (DILAUDID) injection 1 mg (has no administration in time range)  diazepam (VALIUM) injection 10 mg (has no  administration in time range)  HYDROmorphone (DILAUDID) injection 1 mg (1 mg Intravenous Given 11/23/22 0135)  HYDROmorphone (DILAUDID) injection 1 mg (1 mg Intravenous Given 11/23/22 0249)    Mobility walks     Focused Assessments     R Recommendations: See Admitting Provider Note  Report given to:   Additional Notes:

## 2022-11-23 NOTE — Subjective & Objective (Signed)
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  Felt immediate pain in his right hip.  Brought to the ER.  X-rays show comminuted intertrochanteric fracture with medial displacement of the distal fragment.  EDP is consulted orthopedics.  [facility] hospitalist contacted for admission.  Patient denies any head trauma.  No chronic medical issues.

## 2022-11-23 NOTE — Progress Notes (Signed)
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 Felt immediate pain in his right hip. Brought to the ER. X-rays show comminuted intertrochanteric fracture with medial displacement of the distal fragment. EDP  consulted orthopedics. [facility] hospitalist contacted for admission.   Vitals:   11/23/22 0525 11/23/22 0759  BP: 123/73 116/71  Pulse: 96 95  Resp:  18  Temp: 98.3 F (36.8 C) 98.2 F (36.8 C)  SpO2: 98% 97%      A/P  Right hip intertrochanteric fracture with subtrochanteric spiral extension -Orthopedic surgery has been consulted -Placed in Buck's traction today for comfort -Continue Robaxin as needed for muscle spasm Continue Dilaudid as needed for pain  Hypokalemia -Replete   Sharyl Panchal S Emilya Justen [facility]

## 2022-11-24 ENCOUNTER — Inpatient Hospital Stay (HOSPITAL_COMMUNITY): Payer: 59

## 2022-11-24 ENCOUNTER — Inpatient Hospital Stay (HOSPITAL_COMMUNITY): Payer: 59 | Admitting: Anesthesiology

## 2022-11-24 ENCOUNTER — Other Ambulatory Visit: Payer: Self-pay

## 2022-11-24 ENCOUNTER — Encounter (HOSPITAL_COMMUNITY)
Admission: EM | Disposition: A | Discharge status: Home / Self Care | Payer: Self-pay | Source: Home / Self Care | Attending: Family Medicine

## 2022-11-24 ENCOUNTER — Encounter (HOSPITAL_COMMUNITY): Payer: Self-pay | Admitting: Internal Medicine

## 2022-11-24 DIAGNOSIS — S72001A Fracture of unspecified part of neck of right femur, initial encounter for closed fracture: Secondary | ICD-10-CM

## 2022-11-24 HISTORY — PX: FEMUR IM NAIL: SHX1597

## 2022-11-24 SURGERY — INSERTION, INTRAMEDULLARY ROD, FEMUR
Anesthesia: General | Site: Leg Upper | Laterality: Right

## 2022-11-24 MED ORDER — FENTANYL CITRATE (PF) 250 MCG/5ML IJ SOLN
INTRAMUSCULAR | Status: DC | PRN
  Administered 2022-11-24: 100 ug via INTRAVENOUS

## 2022-11-24 MED ORDER — DOCUSATE SODIUM 100 MG PO CAPS
100.0000 mg | ORAL_CAPSULE | Freq: Two times a day (BID) | ORAL | Status: DC
  Administered 2022-11-24 – 2022-11-26 (×4): 100 mg via ORAL
  Filled 2022-11-24 (×4): qty 1

## 2022-11-24 MED ORDER — FENTANYL CITRATE (PF) 100 MCG/2ML IJ SOLN: 100.0000 ug | Freq: Once | INTRAMUSCULAR | Status: AC

## 2022-11-24 MED ORDER — CELECOXIB 200 MG PO CAPS
200.0000 mg | ORAL_CAPSULE | Freq: Once | ORAL | Status: AC
  Administered 2022-11-24: 200 mg via ORAL
  Filled 2022-11-24: qty 1

## 2022-11-24 MED ORDER — OXYCODONE HCL 5 MG PO TABS: 5.0000 mg | ORAL_TABLET | Freq: Once | ORAL | Status: DC | PRN

## 2022-11-24 MED ORDER — CHLORHEXIDINE GLUCONATE 0.12 % MT SOLN
15.0000 mL | Freq: Once | OROMUCOSAL | Status: AC
  Administered 2022-11-24: 15 mL via OROMUCOSAL
  Filled 2022-11-24: qty 15

## 2022-11-24 MED ORDER — ROCURONIUM BROMIDE 10 MG/ML (PF) SYRINGE
PREFILLED_SYRINGE | INTRAVENOUS | Status: AC
  Filled 2022-11-24: qty 10

## 2022-11-24 MED ORDER — LIDOCAINE 2% (20 MG/ML) 5 ML SYRINGE
INTRAMUSCULAR | Status: AC
  Filled 2022-11-24: qty 5

## 2022-11-24 MED ORDER — ACETAMINOPHEN 650 MG RE SUPP: 650.0000 mg | Freq: Four times a day (QID) | RECTAL | Status: DC

## 2022-11-24 MED ORDER — ONDANSETRON HCL 4 MG/2ML IJ SOLN
INTRAMUSCULAR | Status: AC
  Filled 2022-11-24: qty 2

## 2022-11-24 MED ORDER — FENTANYL CITRATE (PF) 250 MCG/5ML IJ SOLN
INTRAMUSCULAR | Status: AC
  Filled 2022-11-24: qty 5

## 2022-11-24 MED ORDER — ONDANSETRON HCL 4 MG/2ML IJ SOLN
INTRAMUSCULAR | Status: DC | PRN
  Administered 2022-11-24: 4 mg via INTRAVENOUS

## 2022-11-24 MED ORDER — CHLORHEXIDINE GLUCONATE 4 % EX SOLN: 60.0000 mL | Freq: Once | CUTANEOUS | Status: DC

## 2022-11-24 MED ORDER — POVIDONE-IODINE 10 % EX SWAB: 2.0000 | Freq: Once | CUTANEOUS | Status: DC

## 2022-11-24 MED ORDER — MENTHOL 3 MG MT LOZG: 1.0000 | LOZENGE | OROMUCOSAL | Status: DC | PRN

## 2022-11-24 MED ORDER — METHOCARBAMOL 500 MG PO TABS
1000.0000 mg | ORAL_TABLET | Freq: Three times a day (TID) | ORAL | Status: DC
  Administered 2022-11-24 – 2022-11-26 (×5): 1000 mg via ORAL
  Filled 2022-11-24 (×5): qty 2

## 2022-11-24 MED ORDER — CEFAZOLIN SODIUM-DEXTROSE 2-4 GM/100ML-% IV SOLN
2.0000 g | Freq: Four times a day (QID) | INTRAVENOUS | Status: AC
  Administered 2022-11-24 – 2022-11-25 (×2): 2 g via INTRAVENOUS
  Filled 2022-11-24 (×2): qty 100

## 2022-11-24 MED ORDER — SUGAMMADEX SODIUM 200 MG/2ML IV SOLN
INTRAVENOUS | Status: DC | PRN
  Administered 2022-11-24: 200 mg via INTRAVENOUS

## 2022-11-24 MED ORDER — PHENOL 1.4 % MT LIQD: 1.0000 | OROMUCOSAL | Status: DC | PRN

## 2022-11-24 MED ORDER — PROPOFOL 10 MG/ML IV BOLUS
INTRAVENOUS | Status: AC
  Filled 2022-11-24: qty 20

## 2022-11-24 MED ORDER — ACETAMINOPHEN 500 MG PO TABS
1000.0000 mg | ORAL_TABLET | Freq: Once | ORAL | Status: AC
  Administered 2022-11-24: 1000 mg via ORAL
  Filled 2022-11-24: qty 2

## 2022-11-24 MED ORDER — ROCURONIUM BROMIDE 10 MG/ML (PF) SYRINGE
PREFILLED_SYRINGE | INTRAVENOUS | Status: DC | PRN
  Administered 2022-11-24: 80 mg via INTRAVENOUS
  Administered 2022-11-24 (×2): 20 mg via INTRAVENOUS

## 2022-11-24 MED ORDER — ACETAMINOPHEN 325 MG PO TABS: 325.0000 mg | ORAL_TABLET | ORAL | Status: DC | PRN

## 2022-11-24 MED ORDER — ONDANSETRON HCL 4 MG PO TABS: 4.0000 mg | ORAL_TABLET | Freq: Four times a day (QID) | ORAL | Status: DC | PRN

## 2022-11-24 MED ORDER — VANCOMYCIN HCL 1000 MG IV SOLR
INTRAVENOUS | Status: AC
  Filled 2022-11-24: qty 20

## 2022-11-24 MED ORDER — PHENYLEPHRINE HCL-NACL 20-0.9 MG/250ML-% IV SOLN
INTRAVENOUS | Status: DC | PRN
  Administered 2022-11-24: 25 ug/min via INTRAVENOUS

## 2022-11-24 MED ORDER — EPHEDRINE 5 MG/ML INJ
INTRAVENOUS | Status: AC
  Filled 2022-11-24: qty 5

## 2022-11-24 MED ORDER — CEFAZOLIN SODIUM-DEXTROSE 2-4 GM/100ML-% IV SOLN: 2.0000 g | INTRAVENOUS | Status: DC

## 2022-11-24 MED ORDER — LACTATED RINGERS IV SOLN: INTRAVENOUS | Status: DC

## 2022-11-24 MED ORDER — ONDANSETRON HCL 4 MG/2ML IJ SOLN: 4.0000 mg | Freq: Four times a day (QID) | INTRAMUSCULAR | Status: DC | PRN

## 2022-11-24 MED ORDER — PROPOFOL 10 MG/ML IV BOLUS
INTRAVENOUS | Status: DC | PRN
Start: 2022-11-24 — End: 2022-11-24
  Administered 2022-11-24: 200 mg via INTRAVENOUS

## 2022-11-24 MED ORDER — METOCLOPRAMIDE HCL 5 MG/ML IJ SOLN: 5.0000 mg | Freq: Three times a day (TID) | INTRAMUSCULAR | Status: DC | PRN

## 2022-11-24 MED ORDER — ONDANSETRON HCL 4 MG/2ML IJ SOLN: 4.0000 mg | Freq: Once | INTRAMUSCULAR | Status: DC | PRN

## 2022-11-24 MED ORDER — ORAL CARE MOUTH RINSE: 15.0000 mL | Freq: Once | OROMUCOSAL | Status: AC

## 2022-11-24 MED ORDER — 0.9 % SODIUM CHLORIDE (POUR BTL) OPTIME
TOPICAL | Status: DC | PRN
  Administered 2022-11-24: 1000 mL

## 2022-11-24 MED ORDER — FENTANYL CITRATE (PF) 100 MCG/2ML IJ SOLN
INTRAMUSCULAR | Status: AC
  Administered 2022-11-24: 100 ug via INTRAVENOUS
  Filled 2022-11-24: qty 2

## 2022-11-24 MED ORDER — CEFAZOLIN SODIUM-DEXTROSE 2-4 GM/100ML-% IV SOLN
2.0000 g | INTRAVENOUS | Status: AC
  Administered 2022-11-24: 2 g via INTRAVENOUS
  Filled 2022-11-24: qty 100

## 2022-11-24 MED ORDER — MEPERIDINE HCL 25 MG/ML IJ SOLN: 6.2500 mg | INTRAMUSCULAR | Status: DC | PRN

## 2022-11-24 MED ORDER — ACETAMINOPHEN 160 MG/5ML PO SOLN: 325.0000 mg | ORAL | Status: DC | PRN

## 2022-11-24 MED ORDER — LIDOCAINE 2% (20 MG/ML) 5 ML SYRINGE
INTRAMUSCULAR | Status: DC | PRN
  Administered 2022-11-24: 100 mg via INTRAVENOUS

## 2022-11-24 MED ORDER — ENOXAPARIN SODIUM 40 MG/0.4ML IJ SOSY
40.0000 mg | PREFILLED_SYRINGE | INTRAMUSCULAR | Status: DC
  Administered 2022-11-25 – 2022-11-26 (×2): 40 mg via SUBCUTANEOUS
  Filled 2022-11-24 (×2): qty 0.4

## 2022-11-24 MED ORDER — PHENYLEPHRINE 80 MCG/ML (10ML) SYRINGE FOR IV PUSH (FOR BLOOD PRESSURE SUPPORT)
PREFILLED_SYRINGE | INTRAVENOUS | Status: DC | PRN
  Administered 2022-11-24: 120 ug via INTRAVENOUS
  Administered 2022-11-24 (×2): 200 ug via INTRAVENOUS
  Administered 2022-11-24: 80 ug via INTRAVENOUS

## 2022-11-24 MED ORDER — METOCLOPRAMIDE HCL 5 MG PO TABS: 5.0000 mg | ORAL_TABLET | Freq: Three times a day (TID) | ORAL | Status: DC | PRN

## 2022-11-24 MED ORDER — ALBUMIN HUMAN 5 % IV SOLN
INTRAVENOUS | Status: DC | PRN
Start: 2022-11-24 — End: 2022-11-24

## 2022-11-24 MED ORDER — MIDAZOLAM HCL 2 MG/2ML IJ SOLN
INTRAMUSCULAR | Status: AC
  Filled 2022-11-24: qty 2

## 2022-11-24 MED ORDER — ACETAMINOPHEN 325 MG PO TABS
650.0000 mg | ORAL_TABLET | Freq: Four times a day (QID) | ORAL | Status: DC
  Administered 2022-11-25 – 2022-11-26 (×7): 650 mg via ORAL
  Filled 2022-11-24 (×7): qty 2

## 2022-11-24 MED ORDER — OXYCODONE HCL 5 MG/5ML PO SOLN: 5.0000 mg | Freq: Once | ORAL | Status: DC | PRN

## 2022-11-24 MED ORDER — FENTANYL CITRATE (PF) 100 MCG/2ML IJ SOLN: 25.0000 ug | INTRAMUSCULAR | Status: DC | PRN

## 2022-11-24 MED ORDER — MIDAZOLAM HCL 2 MG/2ML IJ SOLN
INTRAMUSCULAR | Status: DC | PRN
  Administered 2022-11-24: 2 mg via INTRAVENOUS

## 2022-11-24 SURGICAL SUPPLY — 64 items
BAG COUNTER SPONGE SURGICOUNT (BAG) ×2 IMPLANT
BAG SPNG CNTER NS LX DISP (BAG) ×1
BIT DRILL CANN FLEX 14 (BIT) IMPLANT
BIT DRILL SHORT 4.2 (BIT) IMPLANT
BIT DRILL STEP 4.5X6.5 (BIT) IMPLANT
BNDG CMPR 5X6 CHSV STRCH STRL (GAUZE/BANDAGES/DRESSINGS)
BNDG COHESIVE 6X5 TAN ST LF (GAUZE/BANDAGES/DRESSINGS) IMPLANT
BRUSH SCRUB EZ PLAIN DRY (MISCELLANEOUS) ×4 IMPLANT
COVER PERINEAL POST (MISCELLANEOUS) ×2 IMPLANT
COVER SURGICAL LIGHT HANDLE (MISCELLANEOUS) ×4 IMPLANT
DRAPE C-ARMOR (DRAPES) ×2 IMPLANT
DRAPE HALF SHEET 40X57 (DRAPES) IMPLANT
DRAPE ORTHO SPLIT 77X108 STRL (DRAPES) ×2
DRAPE SURG ORHT 6 SPLT 77X108 (DRAPES) ×4 IMPLANT
DRAPE U-SHAPE 47X51 STRL (DRAPES) ×2 IMPLANT
DRESSING MEPILEX FLEX 4X4 (GAUZE/BANDAGES/DRESSINGS) ×2 IMPLANT
DRILL BIT SHORT 4.2 (BIT) ×1
DRILL BIT STEP 4.5X6.5 (BIT) ×1
DRSG MEPILEX FLEX 4X4 (GAUZE/BANDAGES/DRESSINGS) ×1
DRSG MEPILEX POST OP 4X12 (GAUZE/BANDAGES/DRESSINGS) IMPLANT
DRSG MEPILEX POST OP 4X8 (GAUZE/BANDAGES/DRESSINGS) ×2 IMPLANT
ELECT REM PT RETURN 9FT ADLT (ELECTROSURGICAL) ×1
ELECTRODE REM PT RTRN 9FT ADLT (ELECTROSURGICAL) ×2 IMPLANT
GLOVE BIO SURGEON STRL SZ7.5 (GLOVE) ×2 IMPLANT
GLOVE BIO SURGEON STRL SZ8 (GLOVE) ×2 IMPLANT
GLOVE BIOGEL PI IND STRL 7.5 (GLOVE) ×2 IMPLANT
GLOVE BIOGEL PI IND STRL 8 (GLOVE) ×2 IMPLANT
GLOVE SURG ORTHO LTX SZ7.5 (GLOVE) ×4 IMPLANT
GOWN STRL REUS W/ TWL LRG LVL3 (GOWN DISPOSABLE) ×4 IMPLANT
GOWN STRL REUS W/ TWL XL LVL3 (GOWN DISPOSABLE) ×2 IMPLANT
GOWN STRL REUS W/TWL LRG LVL3 (GOWN DISPOSABLE) ×2
GOWN STRL REUS W/TWL XL LVL3 (GOWN DISPOSABLE) ×1
GUIDEWIRE 3.2X400 (WIRE) IMPLANT
KIT BASIN OR (CUSTOM PROCEDURE TRAY) ×2 IMPLANT
KIT TURNOVER KIT B (KITS) ×2 IMPLANT
MANIFOLD NEPTUNE II (INSTRUMENTS) ×2 IMPLANT
NAIL CANN FRN TI 380 RT (Nail) IMPLANT
NS IRRIG 1000ML POUR BTL (IV SOLUTION) ×2 IMPLANT
PACK GENERAL/GYN (CUSTOM PROCEDURE TRAY) ×2 IMPLANT
PAD ARMBOARD 7.5X6 YLW CONV (MISCELLANEOUS) ×4 IMPLANT
PASSER CERCLAGE STRT LRG DISP (ORTHOPEDIC DISPOSABLE SUPPLIES) IMPLANT
REAMER ROD DEEP FLUTE 2.5X950 (INSTRUMENTS) IMPLANT
SCREW HIP IM NAIL 6.5X105 STRL (Screw) IMPLANT
SCREW HIP IM NAIL 6.5X115 XL (Screw) IMPLANT
SCREW HP F/IM NL 6.5/L100/XL40 (Screw) IMPLANT
SCREW LOCK IM 44X5X (Screw) IMPLANT
SCREW LOCK IM 46X5X TI NIOBIUM (Screw) IMPLANT
SCREW LOCK IM NAIL 5X44 (Screw) ×1 IMPLANT
SCREW LOCK IM NAIL 5X46 (Screw) ×1 IMPLANT
SCREW LOCK IM NAIL 5X54 (Screw) IMPLANT
STAPLER VISISTAT 35W (STAPLE) ×2 IMPLANT
STOCKINETTE IMPERVIOUS LG (DRAPES) IMPLANT
SUT ETHILON 2 0 FS 18 (SUTURE) ×2 IMPLANT
SUT TIGERTAPE CERCLAGE TGRLNK (SUTURE) ×2
SUT VIC AB 0 CT1 27 (SUTURE) ×1
SUT VIC AB 0 CT1 27XBRD ANBCTR (SUTURE) ×2 IMPLANT
SUT VIC AB 1 CT1 27 (SUTURE) ×1
SUT VIC AB 1 CT1 27XBRD ANBCTR (SUTURE) ×2 IMPLANT
SUT VIC AB 2-0 CT1 27 (SUTURE) ×1
SUT VIC AB 2-0 CT1 TAPERPNT 27 (SUTURE) ×2 IMPLANT
SUTURE TIGERTAPE CERCLG TGRLNK (SUTURE) IMPLANT
TOWEL GREEN STERILE (TOWEL DISPOSABLE) ×4 IMPLANT
TOWEL GREEN STERILE FF (TOWEL DISPOSABLE) ×2 IMPLANT
WATER STERILE IRR 1000ML POUR (IV SOLUTION) ×2 IMPLANT

## 2022-11-24 NOTE — Transfer of Care (Signed)
Immediate Anesthesia Transfer of Care Note  Patient: GRAYSYN FREEDLAND  Procedure(s) Performed: INTRAMEDULLARY (IM) NAIL FEMORAL (Right: Leg Upper)  Patient Location: PACU  Anesthesia Type:General  Level of Consciousness: awake and alert   Airway & Oxygen Therapy: Patient Spontanous Breathing and Patient connected to nasal cannula oxygen  Post-op Assessment: Report given to RN and Post -op Vital signs reviewed and stable  Post vital signs: Reviewed and stable  Last Vitals:  Vitals Value Taken Time  BP 127/83 11/24/22 1701  Temp    Pulse 97 11/24/22 1704  Resp 19 11/24/22 1704  SpO2 97 % 11/24/22 1704  Vitals shown include unfiled device data.  Last Pain:  Vitals:   11/24/22 1250  TempSrc:   PainSc: 7          Complications: No notable events documented.

## 2022-11-24 NOTE — Progress Notes (Signed)
CCC Pre-op Review  Pre-op checklist: asked nurse to complete  NPO: MN 8/5  Labs: WNL  Consent: needed, no order placed at this time  H&P: needed from surgeon  Vitals: WNL  O2 requirements: RA  MAR/PTA review: ancef ordered  IV: 20g LFA  Floor nurse name:  Tameshia LPN  Additional info:

## 2022-11-24 NOTE — Progress Notes (Signed)
[facility] Patient not seen on 8/5, in surgery

## 2022-11-24 NOTE — TOC CAGE-AID Note (Signed)
Transition of Care [facility]) - CAGE-AID Screening   Patient Details  Name: Chad Bray MRN: [id number] Date of Birth: [birth year]   Hewitt Shorts, RN Trauma Response Nurse Phone Number: [phone] 11/24/2022, 10:43 AM   Clinical Narrative:    CAGE-AID Screening:    Have You Ever Felt You Ought to Cut Down on Your Drinking or Drug Use?: No Have People Annoyed You By [employer] Your Drinking Or Drug Use?: No Have You Felt Bad Or Guilty About Your Drinking Or Drug Use?: No Have You Ever Had a Drink or Used Drugs First Thing In The Morning to Steady Your Nerves or to Get Rid of a Hangover?: No CAGE-AID Score: 0  Substance Abuse Education Offered: No

## 2022-11-24 NOTE — Anesthesia Procedure Notes (Signed)
Procedure Name: Intubation Date/Time: 11/24/2022 1:45 PM  Performed by: Samara Deist, CRNAPre-anesthesia Checklist: Patient identified, Emergency Drugs available, Suction available and Patient being monitored Patient Re-evaluated:Patient Re-evaluated prior to induction Oxygen Delivery Method: Circle System Utilized Preoxygenation: Pre-oxygenation with 100% oxygen Induction Type: IV induction Ventilation: Mask ventilation without difficulty Laryngoscope Size: Miller and 3 Grade View: Grade I Tube type: Oral Tube size: 8.0 mm Number of attempts: 1 Airway Equipment and Method: Stylet and Oral airway Placement Confirmation: ETT inserted through vocal cords under direct vision, positive ETCO2 and breath sounds checked- equal and bilateral Secured at: 23 cm Tube secured with: Tape Dental Injury: Teeth and Oropharynx as per pre-operative assessment

## 2022-11-24 NOTE — Anesthesia Preprocedure Evaluation (Addendum)
Anesthesia Evaluation  Patient identified by MRN, date of birth, ID band Patient awake    Reviewed: Allergy & Precautions, H&P , NPO status , Patient's Chart, lab work & pertinent test results  Airway Mallampati: II  TM Distance: >3 FB Neck ROM: Full    Dental no notable dental hx. (+) Teeth Intact, Dental Advisory Given   Pulmonary neg pulmonary ROS, Current Smoker and Patient abstained from smoking.   Pulmonary exam normal breath sounds clear to auscultation       Cardiovascular Exercise Tolerance: Good negative cardio ROS Normal cardiovascular exam Rhythm:Regular Rate:Normal     Neuro/Psych negative neurological ROS  negative psych ROS   GI/Hepatic negative GI ROS, Neg liver ROS,,,  Endo/Other  negative endocrine ROS    Renal/GU negative Renal ROS  negative genitourinary   Musculoskeletal negative musculoskeletal ROS (+)    Abdominal   Peds negative pediatric ROS (+)  Hematology negative hematology ROS (+)   Anesthesia Other Findings   Reproductive/Obstetrics negative OB ROS                             Anesthesia Physical Anesthesia Plan  ASA: 2  Anesthesia Plan: General   Post-op Pain Management: Dilaudid IV, Tylenol PO (pre-op)* and Celebrex PO (pre-op)*   Induction: Intravenous  PONV Risk Score and Plan: 2 and Ondansetron and Dexamethasone  Airway Management Planned: Oral ETT  Additional Equipment: None  Intra-op Plan:   Post-operative Plan: Extubation in OR  Informed Consent: I have reviewed the patients History and Physical, chart, labs and discussed the procedure including the risks, benefits and alternatives for the proposed anesthesia with the patient or authorized representative who has indicated his/her understanding and acceptance.     Dental advisory given  Plan Discussed with: Anesthesiologist and CRNA  Anesthesia Plan Comments: (  )         Anesthesia Quick Evaluation

## 2022-11-24 NOTE — Op Note (Signed)
4:55 PM  PATIENT:  Chad Bray  42 y.o. male  PRE-OPERATIVE DIAGNOSIS:  1. COMMINUTED NONDISPLACED RIGHT INTERTROCHANTERIC AND FEMORAL NECK FRACTURE 2. RIGHT PROXIMAL FEMORAL SHAFT FRACTURE 3. RIGHT KNEE HEMARTHROSIS  POST-OPERATIVE DIAGNOSIS:  1. COMMINUTED NONDISPLACED RIGHT INTERTROCHANTERIC AND FEMORAL NECK FRACTURE 2. RIGHT PROXIMAL FEMORAL SHAFT FRACTURE 3. RIGHT KNEE EFFUSION  PROCEDURE:   1. ANTEGRADE INTRAMEDULLARY NAILING OF THE RIGHT HIP with Synthes 9 x 380 mm, statically locked Recon NAIL 2. OPEN REDUCTION INTERNAL FIXATION OF RIGHT FEMORAL SHAFT 3. ASPIRATION OF RIGHT KNEE  4. APPLICATION OF STRESS UNDER FLUOROSCOPY RIGHT KNEE AND HIP  SURGEON:  Surgeon(s) and Role:    Myrene Galas, MD - Primary  PHYSICIAN ASSISTANT: Montez Morita, PA-C  ANESTHESIA:   general  EBL:  500 mL   BLOOD ADMINISTERED:none  DRAINS: none   LOCAL MEDICATIONS USED:  NONE  SPECIMEN:  No Specimen  DISPOSITION OF SPECIMEN:  N/A  COUNTS:  YES  DICTATION: .Note written in EPIC  PLAN OF CARE: Admit to inpatient   PATIENT DISPOSITION:  PACU - hemodynamically stable.   Delay start of Pharmacological VTE agent (>24hrs) due to surgical blood loss or risk of bleeding: no  BRIEF SUMMARY AND INDICATION OF PROCEDURE:  Chad Bray is a 42 y.o. year- old with multiple medical problems.  I discussed with the patient risks and benefits of surgical treatment including the potential for malunion, nonunion, symptomatic hardware, heart attack, stroke, neurovascular injury, bleeding, and others.  After acknowledging these risks, consent was provided to proceed.  BRIEF SUMMARY OF PROCEDURE:  The patient was taken to the operating room where general anesthesia was induced, and then was positioned supine on the radiolucent table with a bump under the operative hip. Traction was used to maintain reduction while standard prep with chlorhexidene wash and then betadine scrub and paint was performed.  After sterile drapes and time-out, C-arm was used to localize the shaft fracture and a lateral incision made. A 8 cm lateral incision was made at the shaft level, the tensor incised, and the vastus lateralis retracted anteriorly. I was then able to clean the fracture site with curette and lavage. My assistant pulled traction while I applied Verbruge clamp to interdigitate and compress the fracture fragments, establishing a near anatomic reduction. This was followed by careful passage of an Arthrex fibertape tensioned loop which further improved reduction and compressed the fracture. I then removed the clamp and passed an additional suture cable tensioned it, as well. Having fixed the shaft fracture, we then positioned it carefully and turned attention to the hip  I used a long instrument was used to identify the appropriate starting position under C-arm on both AP and lateral images.  A 3 cm incision was made proximal to the greater trochanter.  The curved cannulated awl was inserted medial to the trochanter and then the starting guidewire advanced through the piriformis fossa into the proximal femur.  This was checked on AP and lateral views.  The starting reamer was engaged while protecting the soft tissue with a sleeve, making sure I did not propagate the fracture or displaced the femoral neck.   The curved ball-tipped guidewire was then inserted, making sure it safely crossed the fracture site on AP and LAT views and then stayed safely posterior in the distal femur. We sequentially reamed up to 10.5 mm, making sure the fracture remained reduced during reaming, and inserted a 9 X 380 mm nail to the appropriate depth.  The guidewire  for the lag screws was then inserted in appropriate anteversion to make sure it was in a center-center position within the femoral head.  This was measured and the lag screws placed sequentially with excellent purchase and position as checked on both views.  I turned attention  distally where two static locking bolts were placed using perfect circle technique.  Final images were obtained showing excellent reduction, with appropriate hardware placement, trajectory and length.  Montez Morita, PA-C assisted throughout the case with establishment of reduction, reaming while I held reduction, as well as some instrumentation, as well as wound closure. We irrigated and closed the wound in standard layered fashion using running 0 and figure-of-eight 0 Vicryl for the vastus lateralis, interrupted figure-of- eight #1 Vicryl for the tensor and then 2-0 Vicryl and 2-0 nylon for the skin.   Because of the suspected hemarthrosis manual stress was applied under fluoroscopy to the knee in full extension and 30 degrees of flexion which did not show instability. The knee was aspirated of 35 cc of clear synovial fluid, rather than hemarthrosis as expected. Sterile gently compressive dressing was applied.  The patient was awakened from anesthesia and transported to the PACU in stable condition.   PROGNOSIS:  Chad Bray should do well with high rate of healing, given the construct we were able to obtain. Immediate range of motion of the knee, hip and ankle are encouraged and patient may progress to partial weightbearing 50%. Formal pharmacologic DVT prophylaxis has been ordered with Lovenox. A two day stay prior to discharge would be anticipated depending on progression. Office follow up in 2 weeks for removal of sutures and new x-rays.     Doralee Albino. Carola Frost, M.D.

## 2022-11-24 NOTE — Progress Notes (Signed)
Pain medication given, buck traction off pt. Ready for CT

## 2022-11-24 NOTE — Consult Note (Signed)
Reason for Consult:Right hip fx Referring Physician: Mauro Kaufmann Time called: 1191 Time at bedside: 0915   Chad Bray is an 42 y.o. male.  HPI: Chad Bray was trying to jump over a fence and did not clear it. He hell onto his right side and had immediate hip pain. He was unable to get up or bear weight. He was brought to the ED where x-rays showed a right hip fx and orthopedic surgery was consulted. He works as a IT sales professional.  Past Medical History:  Diagnosis Date   GERD (gastroesophageal reflux disease)    Stevens-Johnson syndrome (HCC)    lamictal    Past Surgical History:  Procedure Laterality Date   tibia and fibula surgery Left    2023    History reviewed. No pertinent family history.  Social History:  reports that he has been smoking cigarettes. He has a 1 pack-year smoking history. He has never used smokeless tobacco. He reports current alcohol use of about 5.0 standard drinks of alcohol per week. He reports that he does not use drugs.  Allergies:  Allergies  Allergen Reactions   Lamictal [Lamotrigine] Other (See Comments)    Levonne Spiller Syndrome, 2014    Medications: I have reviewed the patient's current medications.  Results for orders placed or performed during the hospital encounter of 11/23/22 (from the past 48 hour(s))  CBC     Status: None   Collection Time: 11/23/22  1:57 AM  Result Value Ref Range   WBC 8.7 4.0 - 10.5 K/uL   RBC 4.68 4.22 - 5.81 MIL/uL   Hemoglobin 15.1 13.0 - 17.0 g/dL   HCT 47.8 29.5 - 62.1 %   MCV 92.9 80.0 - 100.0 fL   MCH 32.3 26.0 - 34.0 pg   MCHC 34.7 30.0 - 36.0 g/dL   RDW 30.8 65.7 - 84.6 %   Platelets 214 150 - 400 K/uL   nRBC 0.0 0.0 - 0.2 %    Comment: Performed at [facility], [address], [place], [place] [address]  Basic metabolic panel     Status: Abnormal   Collection Time: 11/23/22  1:57 AM  Result Value Ref Range   Sodium 137 135 - 145 mmol/L   Potassium 3.1 (L) 3.5 - 5.1 mmol/L   Chloride 100 98  - 111 mmol/L   CO2 23 22 - 32 mmol/L   Glucose, Bld 120 (H) 70 - 99 mg/dL    Comment: Glucose reference range applies only to samples taken after fasting for at least 8 hours.   BUN 14 6 - 20 mg/dL   Creatinine, Ser 2.84 0.61 - 1.24 mg/dL   Calcium 9.2 8.9 - 13.2 mg/dL   GFR, Estimated >44 >01 mL/min    Comment: (NOTE) Calculated using the CKD-EPI Creatinine Equation (2021)    Anion gap 14 5 - 15    Comment: Performed at [facility], [address], [place], [place] [address]  HIV Antibody (routine testing w rflx)     Status: None   Collection Time: 11/23/22  4:04 AM  Result Value Ref Range   HIV Screen 4th Generation wRfx Non Reactive Non Reactive    Comment: Performed at [facility], [address], [place], [place] [address]  Surgical pcr screen     Status: None   Collection Time: 11/23/22  6:20 AM   Specimen: Nasal Mucosa; Nasal Swab  Result Value Ref Range   MRSA, PCR NEGATIVE NEGATIVE   Staphylococcus  aureus NEGATIVE NEGATIVE    Comment: (NOTE) The Xpert SA Assay (FDA approved for NASAL specimens in patients 73 years of age and older), is one component of a comprehensive surveillance program. It is not intended to diagnose infection nor to guide or monitor treatment. Performed at [facility], [address] [place], [place] [address]   Potassium     Status: None   Collection Time: 11/23/22 12:47 PM  Result Value Ref Range   Potassium 4.0 3.5 - 5.1 mmol/L    Comment: Performed at [facility], [address] [place], [place] [address]    CT HIP RIGHT WO CONTRAST  Result Date: 11/24/2022 CLINICAL DATA:  Right femur fracture EXAM: CT OF THE RIGHT HIP WITHOUT CONTRAST TECHNIQUE: Multidetector CT imaging of the right hip was performed according to the standard protocol. Multiplanar CT image reconstructions were also generated. RADIATION DOSE REDUCTION: This exam was performed according to the departmental dose-optimization program which includes  automated exposure control, adjustment of the mA and/or kV according to patient size and/or use of iterative reconstruction technique. COMPARISON:  X-ray 11/23/2022 FINDINGS: Bones/Joint/Cartilage Acute nondisplaced fracture through the intertrochanteric aspect of the proximal right femur. Fracture extends into the subtrochanteric aspect of the femur with a spiral orientation. The subtrochanteric fracture component is displaced posteriorly by approximately 1 shaft with and demonstrates a moderate degree of varus angulation. The degree of displacement and angulation has worsened compared to the previous radiographs. Hip joint alignment is maintained without dislocation. Mild right hip joint osteoarthritis, slightly advanced for age. No evidence of femoral head avascular necrosis. No lytic or sclerotic bone lesion is seen. Ligaments Suboptimally assessed by CT. Muscles and Tendons Ill-defined intramuscular hematoma within the anterior and posterior compartment of the proximal right thigh at the level of the proximal femoral diaphysis (series 5, image 166). Soft tissues Soft tissue swelling within the proximal right thigh. No right inguinal lymphadenopathy. IMPRESSION: 1. Acute nondisplaced fracture through the intertrochanteric aspect of the proximal right femur. Fracture extends into the subtrochanteric aspect of the femur where there is displacement and angulation, which has worsened compared to the previous radiographs. 2. Ill-defined intramuscular hematoma within the anterior and posterior compartment of the proximal right thigh at the level of the proximal femoral diaphysis. Electronically Signed   By: Duanne Guess D.O.   On: 11/24/2022 10:57   DG Femur Min 2 Views Right  Result Date: 11/23/2022 CLINICAL DATA:  Fall, landing on right hip. EXAM: RIGHT FEMUR 2 VIEWS COMPARISON:  None Available. FINDINGS: There are is a comminuted fracture involving the greater and lesser trochanters with extension into the  proximal femoral diaphysis with medial displacement of the distal fracture fragment. No dislocation. The soft tissues are within normal limits. IMPRESSION: Comminuted intertrochanteric fracture extending into the proximal femoral diaphysis with medial displacement of the distal fracture fragment. Electronically Signed   By: Thornell Sartorius M.D.   On: 11/23/2022 02:23   DG Pelvis Portable  Result Date: 11/23/2022 CLINICAL DATA:  Fall, right hip pain EXAM: PORTABLE PELVIS 1-2 VIEWS COMPARISON:  None Available. FINDINGS: An acute minimally displaced intratrochanteric right hip fracture is seen with fracture fragments in grossly anatomic alignment on this limited examination. Pelvis and visualized proximal left femur appear intact. Soft tissues are unremarkable. IMPRESSION: 1. Acute minimally displaced intratrochanteric right hip fracture. Electronically Signed   By: Helyn Numbers M.D.   On: 11/23/2022 02:19    Review of Systems  HENT:  Negative for ear discharge, ear  pain, hearing loss and tinnitus.   Eyes:  Negative for photophobia and pain.  Respiratory:  Negative for cough and shortness of breath.   Cardiovascular:  Negative for chest pain.  Gastrointestinal:  Negative for abdominal pain, nausea and vomiting.  Genitourinary:  Negative for dysuria, flank pain, frequency and urgency.  Musculoskeletal:  Positive for arthralgias (Right hip). Negative for back pain, myalgias and neck pain.  Neurological:  Negative for dizziness and headaches.  Hematological:  Does not bruise/bleed easily.  Psychiatric/Behavioral:  The patient is not nervous/anxious.    Blood pressure 139/82, pulse 100, temperature 98.6 F (37 C), temperature source Oral, resp. rate 16, height 6' (1.829 m), weight 88.5 kg, SpO2 100%. Physical Exam Constitutional:      General: He is not in acute distress.    Appearance: He is well-developed. He is not diaphoretic.  HENT:     Head: Normocephalic and atraumatic.  Eyes:     General:  No scleral icterus.       Right eye: No discharge.        Left eye: No discharge.     Conjunctiva/sclera: Conjunctivae normal.  Cardiovascular:     Rate and Rhythm: Normal rate and regular rhythm.  Pulmonary:     Effort: Pulmonary effort is normal. No respiratory distress.  Musculoskeletal:     Cervical back: Normal range of motion.     Comments: RLE No traumatic wounds, ecchymosis, or rash  Mod TTP hip, in [place]  No knee or ankle effusion  Knee stable to varus/ valgus and anterior/posterior stress  Sens DPN, SPN, TN intact  Motor EHL 5/5  DP 2+, No significant edema  Skin:    General: Skin is warm and dry.  Neurological:     Mental Status: He is alert.  Psychiatric:        Mood and Affect: Mood normal.        Behavior: Behavior normal.     Assessment/Plan: Right hip fx -- Plan IMN today with Dr. Carola Frost. Please keep NPO.    Freeman Caldron, PA-C Orthopedic Surgery [phone] 11/24/2022, 11:53 AM

## 2022-11-25 ENCOUNTER — Encounter (HOSPITAL_COMMUNITY): Payer: Self-pay | Admitting: Orthopedic Surgery

## 2022-11-25 LAB — BASIC METABOLIC PANEL
Anion gap: 6 (ref 5–15)
BUN: 5 mg/dL — ABNORMAL LOW (ref 6–20)
CO2: 31 mmol/L (ref 22–32)
Calcium: 8.3 mg/dL — ABNORMAL LOW (ref 8.9–10.3)
Chloride: 94 mmol/L — ABNORMAL LOW (ref 98–111)
Creatinine, Ser: 0.93 mg/dL (ref 0.61–1.24)
GFR, Estimated: >60 mL/min (ref >60)
Glucose, Bld: 117 mg/dL — ABNORMAL HIGH (ref 70–99)
Potassium: 3.7 mmol/L (ref 3.5–5.1)
Sodium: 131 mmol/L — ABNORMAL LOW (ref 135–145)

## 2022-11-25 MED ORDER — OXYCODONE HCL 5 MG PO TABS
10.0000 mg | ORAL_TABLET | ORAL | Status: DC | PRN
  Administered 2022-11-25 – 2022-11-26 (×6): 15 mg via ORAL
  Filled 2022-11-25 (×6): qty 3

## 2022-11-25 MED ORDER — KETOROLAC TROMETHAMINE 15 MG/ML IJ SOLN
15.0000 mg | Freq: Three times a day (TID) | INTRAMUSCULAR | Status: DC
  Administered 2022-11-25 – 2022-11-26 (×3): 15 mg via INTRAVENOUS
  Filled 2022-11-25 (×3): qty 1

## 2022-11-25 MED ORDER — HYDROMORPHONE HCL 1 MG/ML IJ SOLN: 1.0000 mg | INTRAMUSCULAR | Status: DC | PRN

## 2022-11-25 MED ORDER — DIPHENHYDRAMINE HCL 25 MG PO CAPS
25.0000 mg | ORAL_CAPSULE | Freq: Four times a day (QID) | ORAL | Status: DC | PRN
  Administered 2022-11-25: 25 mg via ORAL
  Administered 2022-11-26: 50 mg via ORAL
  Administered 2022-11-26: 25 mg via ORAL
  Filled 2022-11-25 (×2): qty 1
  Filled 2022-11-25: qty 2

## 2022-11-25 NOTE — Progress Notes (Signed)
Orthopaedic Trauma Service Progress Note  Patient ID: Chad Bray MRN: [id number] DOB/AGE: [birth year] 42 y.o.  Subjective:  Doing much better today Improved pain control post op  Slept well  Good appetite   Intra-op bone quality has been good  Has been on GLP-1 agonist off and on for about 5 months for weight loss  Vitamin d levels look ok   ROS As above  Objective:   VITALS:   Vitals:   11/24/22 2033 11/25/22 0218 11/25/22 0456 11/25/22 0736  BP: 119/70  120/76 124/78  Pulse: 93  95 (!) 106  Resp: 18  18 17   Temp: 99.7 F (37.6 C)  98.8 F (37.1 C) 98.7 F (37.1 C)  TempSrc:   Oral Oral  SpO2: 96%  97% 97%  Weight:  88.5 kg    Height:        Estimated body mass index is 26.46 kg/m as calculated from the following:   Height as of this encounter: 6' (1.829 m).   Weight as of this encounter: 88.5 kg.   Intake/Output      08/05 0701 08/06 0700 08/06 0701 08/07 0700   P.O. 0    I.V. (mL/kg) 1500 (16.9)    IV Piggyback 450    Total Intake(mL/kg) 1950 (22)    Urine (mL/kg/hr)     Blood 500    Total Output 500    Net +1450           LABS  Results for orders placed or performed during the hospital encounter of 11/23/22 (from the past 24 hour(s))  VITAMIN D 25 Hydroxy (Vit-D Deficiency, Fractures)     Status: None   Collection Time: 11/25/22  1:26 AM  Result Value Ref Range   Vit D, 25-Hydroxy 30.40 30 - 100 ng/mL  CBC     Status: Abnormal   Collection Time: 11/25/22  1:26 AM  Result Value Ref Range   WBC 8.0 4.0 - 10.5 K/uL   RBC 3.75 (L) 4.22 - 5.81 MIL/uL   Hemoglobin 11.9 (L) 13.0 - 17.0 g/dL   HCT 40.9 (L) 81.1 - 91.4 %   MCV 91.5 80.0 - 100.0 fL   MCH 31.7 26.0 - 34.0 pg   MCHC 34.7 30.0 - 36.0 g/dL   RDW 78.2 95.6 - 21.3 %   Platelets 172 150 - 400 K/uL   nRBC 0.0 0.0 - 0.2 %  Comprehensive metabolic panel     Status: Abnormal   Collection Time: 11/25/22  1:26  AM  Result Value Ref Range   Sodium 134 (L) 135 - 145 mmol/L   Potassium 4.5 3.5 - 5.1 mmol/L   Chloride 96 (L) 98 - 111 mmol/L   CO2 31 22 - 32 mmol/L   Glucose, Bld 134 (H) 70 - 99 mg/dL   BUN 5 (L) 6 - 20 mg/dL   Creatinine, Ser 0.86 0.61 - 1.24 mg/dL   Calcium 8.2 (L) 8.9 - 10.3 mg/dL   Total Protein 5.6 (L) 6.5 - 8.1 g/dL   Albumin 3.1 (L) 3.5 - 5.0 g/dL   AST 71 (H) 15 - 41 U/L   ALT 21 0 - 44 U/L   Alkaline Phosphatase 39 38 - 126 U/L   Total Bilirubin 1.1 0.3 - 1.2 mg/dL   GFR, Estimated >57 >84 mL/min  Anion gap 7 5 - 15     PHYSICAL EXAM:   Gen: awake, resting comfortably in bed, NAD Lungs: unlabored Cardiac: reg Abd: + BS, NTND Ext:       Right Lower Extremity   Dressings R thigh are stable  Ext warm   Moderate swelling to thigh but soft  Mild knee effusion   No tenderness over patella, quad tendon, patella tendon   Ext warm  Can perform active knee flexion and extension  +quad set but no SLR due to hip pain   DPN, SPN, TN sensation intact  Ankle flexion, extension, inversion and eversion intact  No DCT  Compartments are soft     Assessment/Plan: 1 Day Post-Op   Principal Problem:   Closed intertrochanteric fracture of hip, right, initial encounter (HCC) Active Problems:   Hypokalemia   Anti-infectives (From admission, onward)    Start     Dose/Rate Route Frequency Ordered Stop   11/24/22 2200  ceFAZolin (ANCEF) IVPB 2g/100 mL premix        2 g 200 mL/hr over 30 Minutes Intravenous Every 6 hours 11/24/22 1821 11/25/22 0543   11/24/22 1200  ceFAZolin (ANCEF) IVPB 2g/100 mL premix  Status:  Discontinued        2 g 200 mL/hr over 30 Minutes Intravenous On call to O.R. 11/24/22 1156 11/24/22 1222   11/24/22 1030  ceFAZolin (ANCEF) IVPB 2g/100 mL premix        2 g 200 mL/hr over 30 Minutes Intravenous On call to O.R. 11/24/22 0981 11/24/22 1405     .  POD/HD#:   42 y/o male s/p fall off about 6 ft with R pertrochanteric fracture  -R  pertrochanteric femur fracture s/p IMN  Weightbearing PWB R leg---> 50% x 4 weeks then WBAT Use crutches or walker    ROM/Activity   Unrestricted ROM R hip and knee   Increase activity as tolerated while maintaining WB restrictions   Wound care   Daily wound care starting tomorrow   Ice prn   Elevate for swelling and pain control  Do not let knee rest in flexion    PT- please teach HEP for R knee ROM- AROM, PROM. Prone exercises as well. No ROM restrictions.  Quad sets, SLR, LAQ, SAQ, heel slides, stretching, prone flexion and extension  Ankle theraband program, heel cord stretching, toe towel curls, etc  No pillows under bend of knee when at rest, ok to place under heel to help work on extension. Can also use zero knee bone foam if available DO NOT LET KNEE REST IN FLEXION!!!!!    Discussed knee effusion with pt and aspiration yield. Given that he has no current knee symptoms and has ambulated without knee pain we will hold on MRI for now    - Pain management:  Multimodal  Favor PO meds over IV   - ABL anemia/Hemodynamics  Stable  Cbc in am   - Medical issues   No chronic issues  - DVT/PE prophylaxis:  Lovenox   DOAC at dc  - ID:   Periop abx  - Metabolic Bone Disease:  Vitamin d levels look ok   Given clinical feel of bone and current labs obtained do not feel additional workup is warranted  - Activity:  As above  - FEN/GI prophylaxis/Foley/Lines:  Reg diet   - Dispo:  Therapy evals  Likely dc home tomorrow     Mearl Latin, PA-C [phone] (C) 11/25/2022, 8:50 AM  [facility] [address] [place] [address] [phone] Collier Bullock (F)    After 5pm and on the weekends please log on to Amion, go to orthopaedics and the look under the [facility] Call for the provider(s) on call. You can also call our office at [phone] and then follow the prompts to be connected to the call team.  Patient ID:  Chad Bray, male   DOB: 10/20/1980, 42 y.o.   MRN: [id number]

## 2022-11-25 NOTE — Evaluation (Signed)
Physical Therapy Evaluation Patient Details Name: Chad Bray MRN: [id number] DOB: 1981/01/15 Today's Date: 11/25/2022  History of Present Illness  Pt is a 42 y.o. M who presents 11/23/2022 after a fall over a fence with right hip intertrochanteric fx, proximal femoral shaft fx and right knee hemarthrosis. Now s/p IMN of right hip, ORIF right femoral shaft and aspiration of right knee. Significant PMH: none.  Clinical Impression  PTA, pt lives alone in a level entry, one story apartment and works as a Scientist, research (medical). Pt presents with fair pain control and is agreeable to PT evaluation. Demonstrates expected post op RLE weakness and decreased ROM. Ambulating limited room distances with a walker. Cueing/instruction provided for weightbearing precautions, transfer and gait training. Further ambulation deferred due to mild dizziness and pt feeling hot. Suspect excellent progress. Recommend OPPT at d/c.      If plan is discharge home, recommend the following: A little help with bathing/dressing/bathroom;Assistance with cooking/housework;Assist for transportation;Help with stairs or ramp for entrance   Can travel by private vehicle        Equipment Recommendations Rolling walker (2 wheels);BSC/3in1  Recommendations for Other Services       Functional Status Assessment Patient has had a recent decline in their functional status and demonstrates the ability to make significant improvements in function in a reasonable and predictable amount of time.     Precautions / Restrictions Precautions Precautions: Fall Restrictions Weight Bearing Restrictions: Yes RLE Weight Bearing: Partial weight bearing RLE Partial Weight Bearing Percentage or Pounds: 50      Mobility  Bed Mobility Overal bed mobility: Needs Assistance Bed Mobility: Supine to Sit     Supine to sit: Min assist     General bed mobility comments: MinA for RLE off edge of bed towards left, good use of upper body     Transfers Overall transfer level: Needs assistance Equipment used: Rolling walker (2 wheels) Transfers: Sit to/from Stand Sit to Stand: Min guard           General transfer comment: Cues for placing RLE anteriorly to prevent weightbearing. Min guard for safety    Ambulation/Gait Ambulation/Gait assistance: Min guard Gait Distance (Feet): 3 Feet Assistive device: Rolling walker (2 wheels) Gait Pattern/deviations: Step-to pattern Gait velocity: decreased Gait velocity interpretation: <1.31 ft/sec, indicative of household ambulator   General Gait Details: Cues for weightbearing precautions, sequencing/technique, walker use. Min guard for Wellsite geologist     Tilt Bed    Modified Rankin (Stroke Patients Only)       Balance Overall balance assessment: Needs assistance Sitting-balance support: Feet supported Sitting balance-Leahy Scale: Fair     Standing balance support: Bilateral upper extremity supported Standing balance-Leahy Scale: Poor                               Pertinent Vitals/Pain Pain Assessment Pain Assessment: Faces Faces Pain Scale: Hurts little more Pain Location: RLE Pain Descriptors / Indicators: Operative site guarding, Grimacing Pain Intervention(s): Limited activity within patient's tolerance, Monitored during session, Premedicated before session, Ice applied    Home Living Family/patient expects to be discharged to:: Private residence Living Arrangements: Alone   Type of Home: Apartment Home Access: Elevator       Home Layout: One level Home Equipment: None      Prior Function Prior Level of Function : Independent/Modified Independent  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 Weak quad contraction, limited heel slide, ankle dorsiflexion WFL    Cervical / Trunk Assessment Cervical / Trunk Assessment: Normal  Communication   Communication: No difficulties  Cognition Arousal/Alertness: Awake/alert Behavior During Therapy: WFL for tasks assessed/performed Overall Cognitive Status: Within Functional Limits for tasks assessed                                          General Comments      Exercises     Assessment/Plan    PT Assessment Patient needs continued PT services  PT Problem List Decreased strength;Decreased activity tolerance;Decreased balance;Decreased mobility;Pain       PT Treatment Interventions DME instruction;Gait training;Stair training;Functional mobility training;Therapeutic activities;Therapeutic exercise;Balance training;Patient/family education    PT Goals (Current goals can be found in the Care Plan section)  Acute Rehab PT Goals Patient Stated Goal: to return to baseline PT Goal Formulation: With patient Time For Goal Achievement: 12/09/22 Potential to Achieve Goals: Good    Frequency Min 1X/week     Co-evaluation               AM-PAC PT "6 Clicks" Mobility  Outcome Measure Help needed turning from your back to your side while in a flat bed without using bedrails?: A Little Help needed moving from lying on your back to sitting on the side of a flat bed without using bedrails?: A Little Help needed moving to and from a bed to a chair (including a wheelchair)?: A Little Help needed standing up from a chair using your arms (e.g., wheelchair or bedside chair)?: A Little Help needed to walk in hospital room?: A Little Help needed climbing 3-5 steps with a railing? : A Lot 6 Click Score: 17    End of Session Equipment Utilized During Treatment: Gait belt Activity Tolerance: Patient tolerated  treatment well Patient left: in chair;with call bell/phone within reach Nurse Communication: Mobility status PT Visit Diagnosis: Other abnormalities of gait and mobility (R26.89);Difficulty in walking, not elsewhere classified (R26.2);Pain Pain - Right/Left: Right Pain - part of body: Leg    Time: 0812-0836 PT Time Calculation (min) (ACUTE ONLY): 24 min   Charges:   PT Evaluation $PT Eval Low Complexity: 1 Low PT Treatments $Therapeutic Activity: 8-22 mins PT General Charges $$ ACUTE PT VISIT: 1 Visit         Lillia Pauls, PT, DPT Acute Rehabilitation Services Office [phone]   Chad Bray 11/25/2022, 9:37 AM

## 2022-11-25 NOTE — Plan of Care (Signed)
  Problem: Health Behavior/Discharge Planning: Goal: Ability to manage health-related needs will improve 11/25/2022 0256 by Delton See, LPN Outcome: Progressing 11/25/2022 0218 by Delton See, LPN Outcome: Progressing   Problem: Education: Goal: Knowledge of General Education information will improve Description: Including pain rating scale, medication(s)/side effects and non-pharmacologic comfort measures Outcome: Progressing   Problem: Clinical Measurements: Goal: Ability to maintain clinical measurements within normal limits will improve Outcome: Progressing Goal: Will remain free from infection 11/25/2022 0256 by Delton See, LPN Outcome: Progressing 11/25/2022 0218 by Delton See, LPN Outcome: Progressing Goal: Diagnostic test results will improve Outcome: Progressing Goal: Respiratory complications will improve 11/25/2022 0256 by Delton See, LPN Outcome: Progressing 11/25/2022 0218 by Delton See, LPN Outcome: Progressing Goal: Cardiovascular complication will be avoided Outcome: Progressing   Problem: Nutrition: Goal: Adequate nutrition will be maintained Outcome: Progressing   Problem: Elimination: Goal: Will not experience complications related to urinary retention Outcome: Progressing   Problem: Coping: Goal: Level of anxiety will decrease Outcome: Progressing

## 2022-11-25 NOTE — Evaluation (Signed)
Occupational Therapy Evaluation Patient Details Name: Chad Bray MRN: [id number] DOB: 26-Mar-1981 Today's Date: 11/25/2022   History of Present Illness Pt is a 42 y.o. M who presents 11/23/2022 after a fall over a fence with right hip intertrochanteric fx, proximal femoral shaft fx and right knee hemarthrosis. Now s/p IMN of right hip, ORIF right femoral shaft and aspiration of right knee. Significant PMH: none.   Clinical Impression   Patient admitted for the diagnosis above.  PTA he lived at home and needed no assist with any aspect of his life.  Currently he is limited by R hip pain, needing up to CGA for mobility and Mod A for lower body ADL.  OT reviewed tub bench and hip kit for consideration, and OT is indicated in the acute setting to address deficits.  No post acute OT is not anticipated.        Recommendations for follow up therapy are one component of a multi-disciplinary discharge planning process, led by the attending physician.  Recommendations may be updated based on patient status, additional functional criteria and insurance authorization.   Assistance Recommended at Discharge Intermittent Supervision/Assistance  Patient can return home with the following Assist for transportation;Assistance with cooking/housework;A little help with bathing/dressing/bathroom    Functional Status Assessment  Patient has had a recent decline in their functional status and demonstrates the ability to make significant improvements in function in a reasonable and predictable amount of time.  Equipment Recommendations  BSC/3in1    Recommendations for Other Services       Precautions / Restrictions Precautions Precautions: Fall Restrictions Weight Bearing Restrictions: Yes RLE Weight Bearing: Partial weight bearing RLE Partial Weight Bearing Percentage or Pounds: 50      Mobility Bed Mobility Overal bed mobility: Needs Assistance Bed Mobility: Supine to Sit, Sit to Supine      Supine to sit: Min assist Sit to supine: Min assist, Mod assist     Patient Response: Cooperative  Transfers Overall transfer level: Needs assistance Equipment used: Rolling walker (2 wheels) Transfers: Sit to/from Stand Sit to Stand: Min guard                  Balance Overall balance assessment: Needs assistance Sitting-balance support: Feet supported Sitting balance-Leahy Scale: Fair   Postural control: Left lateral lean Standing balance support: Bilateral upper extremity supported Standing balance-Leahy Scale: Poor                             ADL either performed or assessed with clinical judgement   ADL                       Lower Body Dressing: Minimal assistance;Moderate assistance;Sit to/from stand   Toilet Transfer: Min guard;Rolling walker (2 wheels);Regular Toilet                   Vision Patient Visual Report: No change from baseline       Perception     Praxis      Pertinent Vitals/Pain Pain Assessment Pain Assessment: Faces Faces Pain Scale: Hurts even more Pain Location: RLE Pain Descriptors / Indicators: Grimacing, Guarding, Sharp Pain Intervention(s): Monitored during session     Hand Dominance Right   Extremity/Trunk Assessment Upper Extremity Assessment Upper Extremity Assessment: Overall WFL for tasks assessed   Lower Extremity Assessment Lower Extremity Assessment: Defer to PT evaluation       Communication Communication Communication:  No difficulties   Cognition Arousal/Alertness: Awake/alert Behavior During Therapy: WFL for tasks assessed/performed Overall Cognitive Status: Within Functional Limits for tasks assessed                                       General Comments   VSS    Exercises     Shoulder Instructions      Home Living Family/patient expects to be discharged to:: Private residence Living Arrangements: Alone Available Help at Discharge: Family;Available  PRN/intermittently Type of Home: Apartment Home Access: Elevator     Home Layout: One level     Bathroom Shower/Tub: Chief Strategy Officer: Standard Bathroom Accessibility: Yes How Accessible: Accessible via walker Home Equipment: None          Prior Functioning/Environment Prior Level of Function : Independent/Modified Independent               ADLs Comments: Ind        OT Problem List: Decreased range of motion;Decreased activity tolerance;Impaired balance (sitting and/or standing);Pain      OT Treatment/Interventions: Self-care/ADL training;Therapeutic activities;Patient/family education;Balance training;DME and/or AE instruction    OT Goals(Current goals can be found in the care plan section) Acute Rehab OT Goals Patient Stated Goal: Return home OT Goal Formulation: With patient Time For Goal Achievement: 12/09/22 Potential to Achieve Goals: Good ADL Goals Pt Will Perform Lower Body Dressing: with modified independence;with adaptive equipment;sit to/from stand Pt Will Transfer to Toilet: with modified independence;ambulating;regular height toilet Pt Will Perform Toileting - Clothing Manipulation and hygiene: with modified independence  OT Frequency: Min 1X/week    Co-evaluation              AM-PAC OT "6 Clicks" Daily Activity     Outcome Measure Help from another person eating meals?: None Help from another person taking care of personal grooming?: None Help from another person toileting, which includes using toliet, bedpan, or urinal?: A Little Help from another person bathing (including washing, rinsing, drying)?: A Lot Help from another person to put on and taking off regular upper body clothing?: None Help from another person to put on and taking off regular lower body clothing?: A Lot 6 Click Score: 19   End of Session Nurse Communication: Mobility status  Activity Tolerance: Patient tolerated treatment well Patient left: in  bed;with call bell/phone within reach;with family/visitor present  OT Visit Diagnosis: Unsteadiness on feet (R26.81);Pain Pain - Right/Left: Right Pain - part of body: Hip;Leg                Time: 1346-1410 OT Time Calculation (min): 24 min Charges:  OT General Charges $OT Visit: 1 Visit OT Evaluation $OT Eval Moderate Complexity: 1 Mod OT Treatments $Self Care/Home Management : 8-22 mins  11/25/2022  RP, OTR/L  Acute Rehabilitation Services  Office:  [phone]   Suzanna Obey 11/25/2022, 2:18 PM

## 2022-11-25 NOTE — Plan of Care (Signed)
  Problem: Health Behavior/Discharge Planning: Goal: Ability to manage health-related needs will improve Outcome: Progressing   Problem: Clinical Measurements: Goal: Ability to maintain clinical measurements within normal limits will improve Outcome: Progressing Goal: Will remain free from infection Outcome: Progressing Goal: Respiratory complications will improve Outcome: Progressing Goal: Cardiovascular complication will be avoided Outcome: Progressing   Problem: Nutrition: Goal: Adequate nutrition will be maintained Outcome: Progressing   Problem: Coping: Goal: Level of anxiety will decrease Outcome: Progressing   Problem: Elimination: Goal: Will not experience complications related to urinary retention Outcome: Progressing

## 2022-11-25 NOTE — TOC Initial Note (Signed)
Transition of Care [facility]) - Initial/Assessment Note    Patient Details  Name: Chad Bray MRN: [id number] Date of Birth: 1980-05-20  Transition of Care [facility]) CM/SW Contact:    Epifanio Lesches, RN Phone Number: 11/25/2022, 2:38 PM  Clinical Narrative:                     - s/p IMN of right hip, ORIF right femoral shaft and aspiration of right knee  From home alone.  States mom to assist with care once d/c. Pt declined recommended DME recommended by PT, RW/BSC. States will get it himself. Pt agreeable to outpatient services. Outpatient PT referral made and noted on AVS.   Pt without RX med concerns or transportation issues. Pt without PCP, appointment arranged with [facility]. [facility]   [phone] [address] [place] [address]     Next Steps: Go on 12/10/2022 Instructions: 2:15 pm with Dr.Ingle, hospital follow and to establish primary care   [facility] team following for needs....  Expected Discharge Plan: Home/Self Care Barriers to Discharge: Continued Medical Work up   Patient Goals and CMS Choice            Expected Discharge Plan and Services                                              Prior Living Arrangements/Services                       Activities of Daily Living      Permission Sought/Granted                  Emotional Assessment              Admission diagnosis:  Closed displaced intertrochanteric fracture of right femur, initial encounter (HCC) [S72.141A] Closed intertrochanteric fracture of hip, right, initial encounter [facility]) [S72.141A] Patient Active Problem List   Diagnosis Date Noted   Closed intertrochanteric fracture of hip, right, initial encounter (HCC) 11/23/2022   Hypokalemia 11/23/2022   PCP:  Patient, No Pcp Per Pharmacy:   [facility] - [place], [place] - [address] [place] [address] Phone: [phone] Fax:  [phone]  [facility] [id number] [place], [place] - [address] [place] [address] Phone: [phone] Fax: [phone]     Social Determinants of Health (SDOH) Social History: SDOH Screenings   Tobacco Use: High Risk (11/24/2022)   SDOH Interventions:     Readmission Risk Interventions     No data to display

## 2022-11-25 NOTE — Anesthesia Postprocedure Evaluation (Signed)
Anesthesia Post Note  Patient: Chad Bray  Procedure(s) Performed: INTRAMEDULLARY (IM) NAIL FEMORAL (Right: Leg Upper)     Patient location during evaluation: PACU Anesthesia Type: General Level of consciousness: awake and alert Pain management: pain level controlled Vital Signs Assessment: post-procedure vital signs reviewed and stable Respiratory status: spontaneous breathing, nonlabored ventilation, respiratory function stable and patient connected to nasal cannula oxygen Cardiovascular status: blood pressure returned to baseline and stable Postop Assessment: no apparent nausea or vomiting Anesthetic complications: no   No notable events documented.  Last Vitals:  Vitals:   11/25/22 0736 11/25/22 1330  BP: 124/78 115/72  Pulse: (!) 106 83  Resp: 17 16  Temp: 37.1 C 36.9 C  SpO2: 97% 98%    Last Pain:  Vitals:   11/25/22 1330  TempSrc: Oral  PainSc:                  Jeraldean Wechter

## 2022-11-26 ENCOUNTER — Other Ambulatory Visit (HOSPITAL_COMMUNITY): Payer: Self-pay

## 2022-11-26 MED ORDER — OXYCODONE-ACETAMINOPHEN 7.5-325 MG PO TABS
1.0000 | ORAL_TABLET | Freq: Four times a day (QID) | ORAL | 0 refills | Status: AC | PRN
Start: 2022-11-26 — End: 2023-11-26
  Filled 2022-11-26: qty 50, 7d supply, fill #0
  Filled 2022-11-26: qty 28, 7d supply, fill #0

## 2022-11-26 MED ORDER — METHOCARBAMOL 500 MG PO TABS
500.0000 mg | ORAL_TABLET | Freq: Four times a day (QID) | ORAL | 0 refills | Status: AC | PRN
  Filled 2022-11-26: qty 60, 8d supply, fill #0

## 2022-11-26 MED ORDER — DOCUSATE SODIUM 100 MG PO CAPS
100.0000 mg | ORAL_CAPSULE | Freq: Two times a day (BID) | ORAL | 0 refills | Status: AC
  Filled 2022-11-26: qty 100, 50d supply, fill #0

## 2022-11-26 MED ORDER — RIVAROXABAN 15 MG PO TABS
15.0000 mg | ORAL_TABLET | Freq: Every day | ORAL | 0 refills | Status: AC
Start: 2022-11-26 — End: 2022-12-26
  Filled 2022-11-26: qty 30, 30d supply, fill #0

## 2022-11-26 MED ORDER — ACETAMINOPHEN 325 MG PO TABS
650.0000 mg | ORAL_TABLET | Freq: Two times a day (BID) | ORAL | 0 refills | Status: AC | PRN
  Filled 2022-11-26: qty 100, 25d supply, fill #0

## 2022-11-26 MED ORDER — HYDROXYZINE PAMOATE 25 MG PO CAPS
25.0000 mg | ORAL_CAPSULE | Freq: Three times a day (TID) | ORAL | 0 refills | Status: AC | PRN
  Filled 2022-11-26: qty 30, 10d supply, fill #0

## 2022-11-26 NOTE — Progress Notes (Signed)
Physical Therapy Treatment Patient Details Name: Chad Bray MRN: [id number] DOB: 08-Apr-1981 Today's Date: 11/26/2022   History of Present Illness Pt is a 42 y.o. M who presents 11/23/2022 after a fall over a fence with right hip intertrochanteric fx, proximal femoral shaft fx and right knee hemarthrosis. Now s/p IMN of right hip, ORIF right femoral shaft and aspiration of right knee. Significant PMH: none.    PT Comments  Continuing work on functional mobility and activity tolerance;  Session focused on progressive amb and muscle activation about the R hip; Provided pt with HEP and he demonstrated self-AAROM well - anticipate good progress; able to walk more than household distance in hallway to day with good maintenance of PWB RLE in stance; Discussed car transfers; Questions solicited and answered; OK for dc home from PT standpoint    If plan is discharge home, recommend the following: A little help with bathing/dressing/bathroom;Assistance with cooking/housework;Assist for transportation;Help with stairs or ramp for entrance   Can travel by private vehicle        Equipment Recommendations  Rolling walker (2 wheels);BSC/3in1    Recommendations for Other Services       Precautions / Restrictions Precautions Precautions: Fall Precaution Comments: Fall risk is present, minimized with use of rW Restrictions Weight Bearing Restrictions: Yes RLE Weight Bearing: Partial weight bearing RLE Partial Weight Bearing Percentage or Pounds: 50     Mobility  Bed Mobility Overal bed mobility: Needs Assistance Bed Mobility: Supine to Sit     Supine to sit: Contact guard     General bed mobility comments: Pt used belt to assist his RLE off of teh bed    Transfers Overall transfer level: Needs assistance Equipment used: Rolling walker (2 wheels) Transfers: Sit to/from Stand Sit to Stand: Supervision           General transfer comment: Cues for placing RLE anteriorly to prevent  weightbearing. Min guard for safety    Ambulation/Gait Ambulation/Gait assistance: Contact guard assist Gait Distance (Feet): 120 Feet Assistive device: Rolling walker (2 wheels) Gait Pattern/deviations: Step-through pattern (emerging)       General Gait Details: Cues to self-monitor for activity tolerance    Stairs             Wheelchair Mobility     Tilt Bed    Modified Rankin (Stroke Patients Only)       Balance     Sitting balance-Leahy Scale: Fair       Standing balance-Leahy Scale: Fair                              Cognition Arousal: Alert Behavior During Therapy: WFL for tasks assessed/performed Overall Cognitive Status: Within Functional Limits for tasks assessed                                          Exercises General Exercises - Lower Extremity Quad Sets: AROM, Right, 5 reps Short Arc Quad: AAROM, Right, 5 reps Heel Slides: AAROM, Right, 5 reps Straight Leg Raises: AAROM, Right (2 reps)    General Comments General comments (skin integrity, edema, etc.): Discussed car transfers; itching around IV site, bedside nurse notified      Pertinent Vitals/Pain Pain Assessment Pain Assessment: Faces Faces Pain Scale: Hurts even more Pain Location: RLE Pain Descriptors / Indicators: Grimacing, Guarding, Sharp Pain  Intervention(s): Limited activity within patient's tolerance    Home Living                          Prior Function            PT Goals (current goals can now be found in the care plan section) Acute Rehab PT Goals Patient Stated Goal: to return to baseline PT Goal Formulation: With patient Time For Goal Achievement: 12/09/22 Potential to Achieve Goals: Good Progress towards PT goals: Progressing toward goals    Frequency    Min 1X/week      PT Plan      Co-evaluation              AM-PAC PT "6 Clicks" Mobility   Outcome Measure  Help needed turning from your back to  your side while in a flat bed without using bedrails?: A Little Help needed moving from lying on your back to sitting on the side of a flat bed without using bedrails?: None Help needed moving to and from a bed to a chair (including a wheelchair)?: None Help needed standing up from a chair using your arms (e.g., wheelchair or bedside chair)?: None Help needed to walk in hospital room?: None Help needed climbing 3-5 steps with a railing? : A Little 6 Click Score: 22    End of Session   Activity Tolerance: Patient tolerated treatment well Patient left: in chair;with call bell/phone within reach Nurse Communication: Mobility status;Other (comment) (and ok for dc) PT Visit Diagnosis: Other abnormalities of gait and mobility (R26.89);Difficulty in walking, not elsewhere classified (R26.2);Pain Pain - Right/Left: Right Pain - part of body: Leg     Time: 1131-1200 PT Time Calculation (min) (ACUTE ONLY): 29 min  Charges:    $Gait Training: 8-22 mins $Therapeutic Exercise: 8-22 mins PT General Charges $$ ACUTE PT VISIT: 1 Visit                     Van Clines, PT  Acute Rehabilitation Services Office [phone] Secure Chat welcomed    Levi Aland 11/26/2022, 12:18 PM

## 2022-11-26 NOTE — TOC Transition Note (Signed)
Transition of Care [facility]) - CM/SW Discharge Note   Patient Details  Name: Chad Bray MRN: [id number] Date of Birth: 12-18-1980  Transition of Care [facility]) CM/SW Contact:  Epifanio Lesches, RN Phone Number: 11/26/2022, 11:30 AM   Clinical Narrative:    Patient will DC to: home Anticipated DC date:11/26/2022 Family notified: yes Transport by: car     S/p IMN to R hip, 8/5 Per MD patient ready for DC today. RN, patient, and patient's family notified of DC.  Pt to f/u with outpatient therapy, noted on AVS. Pt states has DME: RW @ home. Post hospital f/u  noted on AVS.  Pt without RX med concerns. TOC pharmacy to deliver RX meds to bedside prior to d/c. Father will provide transportation to home.  RNCM will sign off for now as intervention is no longer needed. Please consult [place] again if new needs arise.    Final next level of care: Home/Self Care Barriers to Discharge: No Barriers Identified   Patient Goals and CMS Choice      Discharge Placement                         Discharge Plan and Services Additional resources added to the After Visit Summary for                                       Social Determinants of Health (SDOH) Interventions SDOH Screenings   Tobacco Use: High Risk (11/24/2022)     Readmission Risk Interventions     No data to display

## 2022-11-26 NOTE — Discharge Instructions (Addendum)
Orthopaedic Trauma Service Discharge Instructions   General Discharge Instructions  Orthopaedic Injuries:  Right proximal femur fracture treated with intramedullary nailing  WEIGHT BEARING STATUS: Partial weightbearing right leg approximately 50% of body weight.  Will likely advance you to full weightbearing at your first follow-up visit  RANGE OF MOTION/ACTIVITY: Unrestricted range of motion of right hip and knee.  Activity as tolerated while maintaining weightbearing restrictions  Bone health: Vitamin D levels look good.  Continue with your home supplementation  Review the following resource for additional information regarding bone health  [email or web address]  Wound Care: Daily wound care as needed starting on 11/29/2022.  Can leave incisions open to air once there is no drainage.  Clean with soap and water only once there is no drainage.  Discharge Wound Care Instructions  Do NOT apply any ointments, solutions or lotions to pin sites or surgical wounds.  These prevent needed drainage and even though solutions like hydrogen peroxide kill bacteria, they also damage cells lining the pin sites that help fight infection.  Applying lotions or ointments can keep the wounds moist and can cause them to breakdown and open up as well. This can increase the risk for infection. When in doubt call the office.  Surgical incisions should be dressed daily.  If any drainage is noted, use one layer of adaptic or Mepitel, then gauze, and tape.  Alternatively you can use a silicone foam dressing over the incisions which is what you currently have on  [email or web address] [email or web address]  [email or web address]  These dressing supplies should be available at local  medical supply stores ([employer] [employer], [facility], etc). They are not usually carried at places like [facility], [facility], [facility], etc  Once the incision is completely dry and without drainage, it may be left open to air out.  Showering may begin 36-48 hours later.  Cleaning gently with soap and water.  DVT/PE prophylaxis: Xarelto 15 mg daily for the next 30 days for blood clot prevention  Diet: as you were eating previously.  Can use over the counter stool softeners and bowel preparations, such as Miralax, to help with bowel movements.  Narcotics can be constipating.  Be sure to drink plenty of fluids  PAIN MEDICATION USE AND EXPECTATIONS  You have likely been given narcotic medications to help control your pain.  After a traumatic event that results in an fracture (broken bone) with or without surgery, it is ok to use narcotic pain medications to help control one's pain.  We understand that everyone responds to pain differently and each individual patient will be evaluated on a regular basis for the continued need for narcotic medications. Ideally, narcotic medication use should last no more than 6-8 weeks (coinciding with fracture healing).   As a patient it is your responsibility as well to monitor narcotic medication use and report the amount and frequency you use these medications when you come to your office visit.   We would also advise that if you are using narcotic medications, you should take a dose prior to therapy to maximize you participation.  IF YOU ARE ON NARCOTIC MEDICATIONS IT IS NOT PERMISSIBLE TO OPERATE A MOTOR VEHICLE (MOTORCYCLE/CAR/TRUCK/MOPED) OR HEAVY MACHINERY DO NOT MIX NARCOTICS WITH OTHER CNS (CENTRAL NERVOUS SYSTEM) DEPRESSANTS SUCH AS ALCOHOL   POST-OPERATIVE OPIOID TAPER INSTRUCTIONS: It is important to wean off of your opioid medication as soon as possible. If you do not need pain medication after your surgery it is ok to stop day one. Opioids include: Codeine,  Hydrocodone(Norco, Vicodin), Oxycodone(Percocet, oxycontin) and hydromorphone amongst others.  Long term and even short term use of opiods can cause: Increased pain response Dependence Constipation Depression Respiratory depression And more.  Withdrawal symptoms can include Flu like symptoms Nausea, vomiting And more Techniques to manage these symptoms Hydrate well Eat regular healthy meals Stay active Use relaxation techniques(deep breathing, meditating, yoga) Do Not substitute Alcohol to help with tapering If you have been on opioids for less than two weeks and do not have pain than it is ok to stop all together.  Plan to wean off of opioids This plan should start within one week post op of your fracture surgery  Maintain the same interval or time between taking each dose and first decrease the dose.  Cut the total daily intake of opioids by one tablet each day Next start to increase the time between doses. The last dose that should be eliminated is the evening dose.    STOP SMOKING OR USING NICOTINE PRODUCTS!!!!  As discussed nicotine severely impairs your body's ability to heal surgical and traumatic wounds but also impairs bone healing.  Wounds and bone heal by forming microscopic blood vessels (angiogenesis) and nicotine is a vasoconstrictor (essentially, shrinks blood vessels).  Therefore, if vasoconstriction occurs to these microscopic blood vessels they essentially disappear and are unable to deliver necessary nutrients to the healing tissue.  This is one modifiable factor that you can do to dramatically increase your chances of healing your injury.    (This means no smoking, no nicotine gum, patches, etc)  DO NOT USE NONSTEROIDAL ANTI-INFLAMMATORY DRUGS (NSAID'S)  Using products such as Advil (ibuprofen), Aleve (naproxen), Motrin (ibuprofen) for additional pain control during fracture healing can delay and/or prevent the healing response.  If you would like to take over the  counter (OTC) medication, Tylenol (acetaminophen) is ok.  However, some narcotic medications that are given for pain control contain acetaminophen as well. Therefore, you should not exceed more than 4000 mg of tylenol in a day if you do not have liver disease.  Also note that there are may OTC medicines, such as cold medicines and allergy medicines that my contain tylenol as well.  If you have any questions about medications and/or interactions please ask your doctor/PA or your pharmacist.      ICE AND ELEVATE INJURED/OPERATIVE EXTREMITY  Using ice and elevating the injured extremity above your heart can help with swelling and pain control.  Icing in a pulsatile fashion, such as 20 minutes on and 20 minutes off, can be followed.    Do not place ice directly on skin. Make sure there is a barrier between to skin and the ice pack.    Using frozen items such as frozen peas works well as the conform nicely to the are that needs to be iced.  USE AN ACE WRAP OR TED HOSE FOR SWELLING CONTROL  In addition to icing and elevation, Ace wraps or TED hose are used to help limit and resolve swelling.  It is recommended to use Ace wraps or TED hose until you are informed to stop.    When using Ace Wraps start the wrapping distally (farthest away from the body) and wrap proximally (closer to the body)   Example: If you had surgery on your leg or thing and you do not have a splint on, start the ace wrap at the toes and work your way up to the thigh        If you had surgery  on your upper extremity and do not have a splint on, start the ace wrap at your fingers and work your way up to the upper arm  IF YOU ARE IN A SPLINT OR CAST DO NOT REMOVE IT FOR ANY REASON   If your splint gets wet for any reason please contact the office immediately. You may shower in your splint or cast as long as you keep it dry.  This can be done by wrapping in a cast cover or garbage back (or similar)  Do Not stick any thing down your splint  or cast such as pencils, money, or hangers to try and scratch yourself with.  If you feel itchy take benadryl as prescribed on the bottle for itching  IF YOU ARE IN A CAM BOOT (BLACK BOOT)  You may remove boot periodically. Perform daily dressing changes as noted below.  Wash the liner of the boot regularly and wear a sock when wearing the boot. It is recommended that you sleep in the boot until told otherwise    Call office for the following: Temperature greater than 101F Persistent nausea and vomiting Severe uncontrolled pain Redness, tenderness, or signs of infection (pain, swelling, redness, odor or green/yellow discharge around the site) Difficulty breathing, headache or visual disturbances Hives Persistent dizziness or light-headedness Extreme fatigue Any other questions or concerns you may have after discharge  In an emergency, call 911 or go to an Emergency Department at a nearby hospital  HELPFUL INFORMATION  If you had a block, it will wear off between 8-24 hrs postop typically.  This is period when your pain may go from nearly zero to the pain you would have had postop without the block.  This is an abrupt transition but nothing dangerous is happening.  You may take an extra dose of narcotic when this happens.  You should wean off your narcotic medicines as soon as you are able.  Most patients will be off or using minimal narcotics before their first postop appointment.   We suggest you use the pain medication the first night prior to going to bed, in order to ease any pain when the anesthesia wears off. You should avoid taking pain medications on an empty stomach as it will make you nauseous.  Do not drink alcoholic beverages or take illicit drugs when taking pain medications.  In most states it is against the law to drive while you are in a splint or sling.  And certainly against the law to drive while taking narcotics.  You may return to work/school in the next couple of  days when you feel up to it.   Pain medication may make you constipated.  Below are a few solutions to try in this order: Decrease the amount of pain medication if you aren't having pain. Drink lots of decaffeinated fluids. Drink prune juice and/or each dried prunes  If the first 3 don't work start with additional solutions Take Colace - an over-the-counter stool softener Take Senokot - an over-the-counter laxative Take Miralax - a stronger over-the-counter laxative     CALL THE OFFICE WITH ANY QUESTIONS OR CONCERNS: [phone]   VISIT OUR WEBSITE FOR ADDITIONAL INFORMATION: [email or web address]

## 2022-11-26 NOTE — Plan of Care (Signed)
Problem: Education: Goal: Knowledge of General Education information will improve Description: Including pain rating scale, medication(s)/side effects and non-pharmacologic comfort measures 11/26/2022 1152 by Ernie Hew, RN Outcome: Adequate for Discharge 11/26/2022 1152 by Ernie Hew, RN Outcome: Adequate for Discharge   Problem: Health Behavior/Discharge Planning: Goal: Ability to manage health-related needs will improve 11/26/2022 1152 by Ernie Hew, RN Outcome: Adequate for Discharge 11/26/2022 1152 by Ernie Hew, RN Outcome: Adequate for Discharge   Problem: Clinical Measurements: Goal: Ability to maintain clinical measurements within normal limits will improve 11/26/2022 1152 by Ernie Hew, RN Outcome: Adequate for Discharge 11/26/2022 1152 by Ernie Hew, RN Outcome: Adequate for Discharge Goal: Will remain free from infection 11/26/2022 1152 by Ernie Hew, RN Outcome: Adequate for Discharge 11/26/2022 1152 by Ernie Hew, RN Outcome: Adequate for Discharge Goal: Diagnostic test results will improve 11/26/2022 1152 by Ernie Hew, RN Outcome: Adequate for Discharge 11/26/2022 1152 by Ernie Hew, RN Outcome: Adequate for Discharge Goal: Respiratory complications will improve 11/26/2022 1152 by Ernie Hew, RN Outcome: Adequate for Discharge 11/26/2022 1152 by Ernie Hew, RN Outcome: Adequate for Discharge Goal: Cardiovascular complication will be avoided 11/26/2022 1152 by Ernie Hew, RN Outcome: Adequate for Discharge 11/26/2022 1152 by Ernie Hew, RN Outcome: Adequate for Discharge   Problem: Activity: Goal: Risk for activity intolerance will decrease 11/26/2022 1152 by Ernie Hew, RN Outcome: Adequate for Discharge 11/26/2022 1152 by Ernie Hew, RN Outcome: Adequate for Discharge   Problem: Nutrition: Goal: Adequate nutrition will be maintained 11/26/2022 1152 by Ernie Hew, RN Outcome:  Adequate for Discharge 11/26/2022 1152 by Ernie Hew, RN Outcome: Adequate for Discharge   Problem: Coping: Goal: Level of anxiety will decrease 11/26/2022 1152 by Ernie Hew, RN Outcome: Adequate for Discharge 11/26/2022 1152 by Ernie Hew, RN Outcome: Adequate for Discharge   Problem: Elimination: Goal: Will not experience complications related to bowel motility 11/26/2022 1152 by Ernie Hew, RN Outcome: Adequate for Discharge 11/26/2022 1152 by Ernie Hew, RN Outcome: Adequate for Discharge Goal: Will not experience complications related to urinary retention 11/26/2022 1152 by Ernie Hew, RN Outcome: Adequate for Discharge 11/26/2022 1152 by Ernie Hew, RN Outcome: Adequate for Discharge   Problem: Pain Managment: Goal: General experience of comfort will improve 11/26/2022 1152 by Ernie Hew, RN Outcome: Adequate for Discharge 11/26/2022 1152 by Ernie Hew, RN Outcome: Adequate for Discharge   Problem: Safety: Goal: Ability to remain free from injury will improve 11/26/2022 1152 by Ernie Hew, RN Outcome: Adequate for Discharge 11/26/2022 1152 by Ernie Hew, RN Outcome: Adequate for Discharge   Problem: Skin Integrity: Goal: Risk for impaired skin integrity will decrease 11/26/2022 1152 by Ernie Hew, RN Outcome: Adequate for Discharge 11/26/2022 1152 by Ernie Hew, RN Outcome: Adequate for Discharge   Problem: Education: Goal: Verbalization of understanding the information provided (i.e., activity precautions, restrictions, etc) will improve 11/26/2022 1152 by Ernie Hew, RN Outcome: Adequate for Discharge 11/26/2022 1152 by Ernie Hew, RN Outcome: Adequate for Discharge Goal: Individualized Educational Video(s) 11/26/2022 1152 by Ernie Hew, RN Outcome: Adequate for Discharge 11/26/2022 1152 by Ernie Hew, RN Outcome: Adequate for Discharge   Problem: Activity: Goal: Ability to ambulate  and perform ADLs will improve 11/26/2022 1152 by Ernie Hew, RN Outcome: Adequate for Discharge 11/26/2022 1152 by Ernie Hew, RN Outcome: Adequate for Discharge  Problem: Clinical Measurements: Goal: Postoperative complications will be avoided or minimized 11/26/2022 1152 by Ernie Hew, RN Outcome: Adequate for Discharge 11/26/2022 1152 by Ernie Hew, RN Outcome: Adequate for Discharge   Problem: Self-Concept: Goal: Ability to maintain and perform role responsibilities to the fullest extent possible will improve 11/26/2022 1152 by Ernie Hew, RN Outcome: Adequate for Discharge 11/26/2022 1152 by Ernie Hew, RN Outcome: Adequate for Discharge   Problem: Pain Management: Goal: Pain level will decrease 11/26/2022 1152 by Ernie Hew, RN Outcome: Adequate for Discharge 11/26/2022 1152 by Ernie Hew, RN Outcome: Adequate for Discharge   Problem: Acute Rehab PT Goals(only PT should resolve) Goal: Pt Will Go Supine/Side To Sit Outcome: Adequate for Discharge Goal: Pt Will Go Sit To Supine/Side Outcome: Adequate for Discharge Goal: Patient Will Transfer Sit To/From Stand Outcome: Adequate for Discharge Goal: Pt Will Ambulate Outcome: Adequate for Discharge Goal: Pt/caregiver will Perform Home Exercise Program Outcome: Adequate for Discharge   Problem: Acute Rehab OT Goals (only OT should resolve) Goal: Pt. Will Perform Lower Body Dressing Outcome: Adequate for Discharge   Problem: Acute Rehab OT Goals (only OT should resolve) Goal: Pt. Will Transfer To Toilet Outcome: Adequate for Discharge

## 2022-11-26 NOTE — Progress Notes (Signed)
Explained discharge instructions to patient. Reviewed follow up appointment and next medication administration times. Also reviewed education. Patient verbalized having an understanding for instructions given. All belongings are in the patient's possession to include TOC meds. IV was removed. No other needs verbalized. Transported downstairs for discharge.

## 2022-11-26 NOTE — Discharge Summary (Signed)
Orthopaedic Trauma Service (OTS) Discharge Summary   Patient ID: Chad Bray MRN: [id number] DOB/AGE: [birth year] 42 y.o.  Admit date: 11/23/2022 Discharge date: 11/26/2022  Admission Diagnoses: Closed right peritrochanteric hip fracture  Discharge Diagnoses:  Principal Problem:   Closed intertrochanteric fracture of hip, right, initial encounter [facility])   Past Medical History:  Diagnosis Date   GERD (gastroesophageal reflux disease)    Stevens-Johnson syndrome (HCC)    lamictal     Procedures Performed: 11/24/2022-Dr. Carola Frost 1. ANTEGRADE INTRAMEDULLARY NAILING OF THE RIGHT HIP with Synthes 9 x 380 mm, statically locked Recon NAIL 2. OPEN REDUCTION INTERNAL FIXATION OF RIGHT FEMORAL SHAFT 3. ASPIRATION OF RIGHT KNEE  4. APPLICATION OF STRESS UNDER FLUOROSCOPY RIGHT KNEE AND HIP    Discharged Condition: good  Hospital Course:   Patient is a 42 year old male who was admitted on 11/23/2022 with comminuted displaced right peritrochanteric hip fracture.  Patient fell over a 6 foot fence landing directly on his right side.  He was brought to [facility] where he was found to have this right femur fracture.  Due to the complexity of the injury the on-call orthopedic surgeon asserted that it is outside the scope of his practice and requested formal evaluation and treatment by the orthopedic trauma service.  Patient was seen and evaluated by the orthopedic trauma service on 11/24/2022.  He was taken to the operating room where the procedure above was performed.  Patient's hospital stay was really was uncomplicated.  No issues were noted during his stay.  He worked with therapy starting on postoperative day #1 and it was doing very well.  Still was requiring some IV pain medication on postoperative day #1 but was transitioning to oral medications.  On postop day #2 he is doing very well had good pain relief with oral pain medications and was mobilizing much better.  He was  tolerating regular diet, voiding without difficulty and passing gas.  On postoperative day #2 he was deemed stable for discharge to home. Dressings were changed on postop day #2 and wound care was reviewed with the patient and mom.  He was started on Lovenox for DVT and PE prophylaxis postoperatively and will be discharged on Xarelto 15 mg daily for the next 30 days for DVT and PE prophylaxis  He was covered with Ancef for perioperative antibiotic coverage  Labs were checked given the mechanism of injury and concern for possible metabolic bone disease.  His vitamin D levels are appropriate and his clinical bone quality was of expected strength given his age therefore no additional laboratory workup was pursued.  He can continue his home regimen  Patient discharged in stable condition on 11/26/2022.  He will follow-up with orthopedics in 10 to 14 days for suture removal and follow-up x-rays.  we will likely advance his weightbearing from partial weightbearing to full weightbearing at that time  Would anticipate him being out of work for 3 to 4 weeks.  He does mostly sedentary job.  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   Consults:  Internal medicine  Significant Diagnostic Studies: labs:    Latest Reference Range & Units 11/25/22 01:26 11/25/22 10:55 11/26/22 00:38  Sodium 135 - 145 mmol/L 134 (L) 131 (L)   Potassium 3.5 - 5.1 mmol/L 4.5 3.7   Chloride 98 - 111 mmol/L 96 (L) 94 (L)   CO2 22 - 32 mmol/L 31 31   Glucose 70 - 99 mg/dL 220 (  H) 117 (H)   BUN 6 - 20 mg/dL 5 (L) 5 (L)   Creatinine 0.61 - 1.24 mg/dL 1.61 0.96   Calcium 8.9 - 10.3 mg/dL 8.2 (L) 8.3 (L)   Anion gap 5 - 15  7 6    Alkaline Phosphatase 38 - 126 U/L 39    Albumin 3.5 - 5.0 g/dL 3.1 (L)    AST 15 - 41 U/L 71 (H)    ALT 0 - 44 U/L 21    Total Protein 6.5 - 8.1 g/dL 5.6 (L)    Total Bilirubin 0.3 - 1.2 mg/dL 1.1    GFR, Estimated >04 mL/min >60 >60   Vitamin D, 25-Hydroxy 30 - 100  ng/mL 30.40    WBC 4.0 - 10.5 K/uL 8.0  5.9  RBC 4.22 - 5.81 MIL/uL 3.75 (L)  3.11 (L)  Hemoglobin 13.0 - 17.0 g/dL 54.0 (L)  98.1 (L)  HCT 39.0 - 52.0 % 34.3 (L)  29.3 (L)  MCV 80.0 - 100.0 fL 91.5  94.2  MCH 26.0 - 34.0 pg 31.7  32.8  MCHC 30.0 - 36.0 g/dL 19.1  47.8  RDW 29.5 - 15.5 % 11.9  11.9  Platelets 150 - 400 K/uL 172  147 (L)  nRBC 0.0 - 0.2 % 0.0  0.0  (L): Data is abnormally low (H): Data is abnormally high  Treatments: IV hydration, antibiotics: Ancef, analgesia: acetaminophen, Dilaudid, and oxycodone, anticoagulation: Lovenox while inpatient and Xarelto discharge, therapies: PT, OT, and RN, and surgery: As above  Discharge Exam:    Orthopaedic Trauma Service Progress Note   Patient ID: Chad Bray MRN: [id number] DOB/AGE: 03-01-1981 43 y.o.   Subjective:   Doing well Pain improved No complaints Wants to go home today      ROS As above   Objective:    VITALS:         Vitals:    11/25/22 1330 11/25/22 1944 11/26/22 0412 11/26/22 0715  BP: 115/72 113/65 106/64 112/66  Pulse: 83 96 91    Resp: 16 16 16 17   Temp: 98.4 F (36.9 C) 99 F (37.2 C)   97.9 F (36.6 C)  TempSrc: Oral Oral   Oral  SpO2: 98% 98% 93% 97%  Weight:          Height:              Estimated body mass index is 26.46 kg/m as calculated from the following:   Height as of this encounter: 6' (1.829 m).   Weight as of this encounter: 88.5 kg.     Intake/Output      08/06 0701 08/07 0700 08/07 0701 08/08 0700   P.O. 500    I.V. (mL/kg)     IV Piggyback     Total Intake(mL/kg) 500 (5.6)    Urine (mL/kg/hr) 390 (0.2)    Blood     Total Output 390    Net +110            LABS   Lab Results Last 24 Hours       Results for orders placed or performed during the hospital encounter of 11/23/22 (from the past 24 hour(s))  Basic metabolic panel     Status: Abnormal    Collection Time: 11/25/22 10:55 AM  Result Value Ref Range    Sodium 131 (L) 135 - 145 mmol/L     Potassium 3.7 3.5 - 5.1 mmol/L    Chloride 94 (L) 98 - 111 mmol/L  CO2 31 22 - 32 mmol/L    Glucose, Bld 117 (H) 70 - 99 mg/dL    BUN 5 (L) 6 - 20 mg/dL    Creatinine, Ser 2.59 0.61 - 1.24 mg/dL    Calcium 8.3 (L) 8.9 - 10.3 mg/dL    GFR, Estimated >56 >38 mL/min    Anion gap 6 5 - 15  CBC     Status: Abnormal    Collection Time: 11/26/22 12:38 AM  Result Value Ref Range    WBC 5.9 4.0 - 10.5 K/uL    RBC 3.11 (L) 4.22 - 5.81 MIL/uL    Hemoglobin 10.2 (L) 13.0 - 17.0 g/dL    HCT 75.6 (L) 43.3 - 52.0 %    MCV 94.2 80.0 - 100.0 fL    MCH 32.8 26.0 - 34.0 pg    MCHC 34.8 30.0 - 36.0 g/dL    RDW 29.5 18.8 - 41.6 %    Platelets 147 (L) 150 - 400 K/uL    nRBC 0.0 0.0 - 0.2 %          PHYSICAL EXAM:    Gen: awake, resting comfortably in bed, NAD Lungs: unlabored Cardiac: reg Abd: + BS, NTND Ext:       Right Lower Extremity              Dressings R thigh are stable                         All dressings removed                         Incisions look great                          No signs of infection              Ext warm              Moderate swelling to thigh but soft             Mild knee effusion              No tenderness over patella, quad tendon, patella tendon              Ext warm             Can perform active knee flexion and extension             +quad set but no SLR due to hip pain              Much improved ability to do heel slides             DPN, SPN, TN sensation intact             Ankle flexion, extension, inversion and eversion intact             No DCT             Compartments are soft    Assessment/Plan: 2 Days Post-Op    Principal Problem:   Closed intertrochanteric fracture of hip, right, initial encounter (HCC)     Anti-infectives (From admission, onward)        Start     Dose/Rate Route Frequency Ordered Stop    11/24/22 2200   ceFAZolin (ANCEF) IVPB 2g/100 mL premix        2 g 200  mL/hr over 30 Minutes Intravenous Every 6 hours  11/24/22 1821 11/25/22 0543    11/24/22 1200   ceFAZolin (ANCEF) IVPB 2g/100 mL premix  Status:  Discontinued        2 g 200 mL/hr over 30 Minutes Intravenous On call to O.R. 11/24/22 1156 11/24/22 1222    11/24/22 1030   ceFAZolin (ANCEF) IVPB 2g/100 mL premix        2 g 200 mL/hr over 30 Minutes Intravenous On call to O.R. 11/24/22 1610 11/24/22 1405         .   POD/HD#:2   42 y/o male s/p fall off about 6 ft with R pertrochanteric fracture   -R pertrochanteric femur fracture s/p IMN  Weightbearing PWB R leg---> 50% x 4 weeks then WBAT Use crutches or walker                ROM/Activity                         Unrestricted ROM R hip and knee                         Increase activity as tolerated while maintaining WB restrictions               Wound care                         Dressings changed today.  New Mepilex is applied.  Patient to change again 48 to 72 hours.  Leave open to air once there is no drainage and can clean with soap and water only.  He may only shower no submerging in bathtub or pool               Ice prn              Elevate for swelling and pain control             Do not let knee rest in flexion                PT- please teach HEP for R knee ROM- AROM, PROM. Prone exercises as well. No ROM restrictions.  Quad sets, SLR, LAQ, SAQ, heel slides, stretching, prone flexion and extension   Ankle theraband program, heel cord stretching, toe towel curls, etc   No pillows under bend of knee when at rest, ok to place under heel to help work on extension. Can also use zero knee bone foam if available DO NOT LET KNEE REST IN FLEXION!!!!!                 Discussed knee effusion with pt and aspiration yield. Given that he has no current knee symptoms and has ambulated without knee pain we will hold on MRI for now      - Pain management:             Multimodal             Favor PO meds over IV    - ABL anemia/Hemodynamics             Stable   - Medical issues               No chronic issues   - DVT/PE prophylaxis:           Xarelto for 30 days of discharge - ID:  Periop abx complete    - Metabolic Bone Disease:             Vitamin d levels look ok              Given clinical feel of bone and current labs obtained do not feel additional workup is warranted    - Activity:             As above   - FEN/GI prophylaxis/Foley/Lines:             Reg diet    - Dispo:             Discharge home today             Will refer to outpatient therapy at his first postoperative follow-up visit.             Anticipate him being out of work for about 3 to 4 weeks    Disposition: Discharge disposition: 01-Home or Self Care       Discharge Instructions     Call MD / Call 911   Complete by: As directed    If you experience chest pain or shortness of breath, CALL 911 and be transported to the hospital emergency room.  If you develope a fever above 101 F, pus (white drainage) or increased drainage or redness at the wound, or calf pain, call your surgeon's office.   Constipation Prevention   Complete by: As directed    Drink plenty of fluids.  Prune juice may be helpful.  You may use a stool softener, such as Colace (over the counter) 100 mg twice a day.  Use MiraLax (over the counter) for constipation as needed.   Diet general   Complete by: As directed    Discharge instructions   Complete by: As directed    Orthopaedic Trauma Service Discharge Instructions   General Discharge Instructions  Orthopaedic Injuries:  Right proximal femur fracture treated with intramedullary nailing  WEIGHT BEARING STATUS: Partial weightbearing right leg approximately 50% of body weight.  Will likely advance you to full weightbearing at your first follow-up visit  RANGE OF MOTION/ACTIVITY: Unrestricted range of motion of right hip and knee.  Activity as tolerated while maintaining weightbearing restrictions  Bone health: Vitamin D levels look good.   Continue with your home supplementation  Review the following resource for additional information regarding bone health  [email or web address].cy  Wound Care: Daily wound care as needed starting on 11/29/2022.  Can leave incisions open to air once there is no drainage.  Clean with soap and water only once there is no drainage.  Discharge Wound Care Instructions  Do NOT apply any ointments, solutions or lotions to pin sites or surgical wounds.  These prevent needed drainage and even though solutions like hydrogen peroxide kill bacteria, they also damage cells lining the pin sites that help fight infection.  Applying lotions or ointments can keep the wounds moist and can cause them to breakdown and open up as well. This can increase the risk for infection. When in doubt call the office.  Surgical incisions should be dressed daily.  If any drainage is noted, use one layer of adaptic or Mepitel, then gauze, and tape.  Alternatively you can use a silicone foam dressing over the incisions which is what you currently have on  [email or web address] [email or web address]  [email or web address]  These dressing supplies should be available at local medical supply stores ([employer], [employer], etc). They are not usually carried  at places like [facility], [facility], [employer], etc  Once the incision is completely dry and without drainage, it may be left open to air out.  Showering may begin 36-48 hours later.  Cleaning gently with soap and water.  DVT/PE prophylaxis: Xarelto 15 mg daily for the next 30 days for blood clot prevention  Diet: as you were eating previously.  Can use over the counter stool softeners and bowel preparations, such as Miralax, to help with bowel  movements.  Narcotics can be constipating.  Be sure to drink plenty of fluids  PAIN MEDICATION USE AND EXPECTATIONS  You have likely been given narcotic medications to help control your pain.  After a traumatic event that results in an fracture (broken bone) with or without surgery, it is ok to use narcotic pain medications to help control one's pain.  We understand that everyone responds to pain differently and each individual patient will be evaluated on a regular basis for the continued need for narcotic medications. Ideally, narcotic medication use should last no more than 6-8 weeks (coinciding with fracture healing).   As a patient it is your responsibility as well to monitor narcotic medication use and report the amount and frequency you use these medications when you come to your office visit.   We would also advise that if you are using narcotic medications, you should take a dose prior to therapy to maximize you participation.  IF YOU ARE ON NARCOTIC MEDICATIONS IT IS NOT PERMISSIBLE TO OPERATE A MOTOR VEHICLE (MOTORCYCLE/CAR/TRUCK/MOPED) OR HEAVY MACHINERY DO NOT MIX NARCOTICS WITH OTHER CNS (CENTRAL NERVOUS SYSTEM) DEPRESSANTS SUCH AS ALCOHOL   POST-OPERATIVE OPIOID TAPER INSTRUCTIONS:  It is important to wean off of your opioid medication as soon as possible. If you do not need pain medication after your surgery it is ok to stop day one.  Opioids include:  o Codeine, Hydrocodone(Norco, Vicodin), Oxycodone(Percocet, oxycontin) and hydromorphone amongst others.   Long term and even short term use of opiods can cause:  o Increased pain response  o Dependence  o Constipation  o Depression  o Respiratory depression  o And more.   Withdrawal symptoms can include  o Flu like symptoms  o Nausea, vomiting  o And more  Techniques to manage these symptoms  o Hydrate well  o Eat regular healthy meals  o Stay active  o Use relaxation techniques(deep breathing, meditating,  yoga)  Do Not substitute Alcohol to help with tapering  If you have been on opioids for less than two weeks and do not have pain than it is ok to stop all together.   Plan to wean off of opioids  o This plan should start within one week post op of your fracture surgery   o Maintain the same interval or time between taking each dose and first decrease the dose.   o Cut the total daily intake of opioids by one tablet each day  o Next start to increase the time between doses.  o The last dose that should be eliminated is the evening dose.    STOP SMOKING OR USING NICOTINE PRODUCTS!!!!  As discussed nicotine severely impairs your body's ability to heal surgical and traumatic wounds but also impairs bone healing.  Wounds and bone heal by forming microscopic blood vessels (angiogenesis) and nicotine is a vasoconstrictor (essentially, shrinks blood vessels).  Therefore, if vasoconstriction occurs to these microscopic blood vessels they essentially disappear and are unable to deliver necessary nutrients to the healing tissue.  This  is one modifiable factor that you can do to dramatically increase your chances of healing your injury.    (This means no smoking, no nicotine gum, patches, etc)  DO NOT USE NONSTEROIDAL ANTI-INFLAMMATORY DRUGS (NSAID'S)  Using products such as Advil (ibuprofen), Aleve (naproxen), Motrin (ibuprofen) for additional pain control during fracture healing can delay and/or prevent the healing response.  If you would like to take over the counter (OTC) medication, Tylenol (acetaminophen) is ok.  However, some narcotic medications that are given for pain control contain acetaminophen as well. Therefore, you should not exceed more than 4000 mg of tylenol in a day if you do not have liver disease.  Also note that there are may OTC medicines, such as cold medicines and allergy medicines that my contain tylenol as well.  If you have any questions about medications and/or interactions please  ask your doctor/PA or your pharmacist.      ICE AND ELEVATE INJURED/OPERATIVE EXTREMITY  Using ice and elevating the injured extremity above your heart can help with swelling and pain control.  Icing in a pulsatile fashion, such as 20 minutes on and 20 minutes off, can be followed.    Do not place ice directly on skin. Make sure there is a barrier between to skin and the ice pack.    Using frozen items such as frozen peas works well as the conform nicely to the are that needs to be iced.  USE AN ACE WRAP OR TED HOSE FOR SWELLING CONTROL  In addition to icing and elevation, Ace wraps or TED hose are used to help limit and resolve swelling.  It is recommended to use Ace wraps or TED hose until you are informed to stop.    When using Ace Wraps start the wrapping distally (farthest away from the body) and wrap proximally (closer to the body)   Example: If you had surgery on your leg or thing and you do not have a splint on, start the ace wrap at the toes and work your way up to the thigh        If you had surgery on your upper extremity and do not have a splint on, start the ace wrap at your fingers and work your way up to the upper arm  IF YOU ARE IN A SPLINT OR CAST DO NOT REMOVE IT FOR ANY REASON   If your splint gets wet for any reason please contact the office immediately. You may shower in your splint or cast as long as you keep it dry.  This can be done by wrapping in a cast cover or garbage back (or similar)  Do Not stick any thing down your splint or cast such as pencils, money, or hangers to try and scratch yourself with.  If you feel itchy take benadryl as prescribed on the bottle for itching  IF YOU ARE IN A CAM BOOT (BLACK BOOT)  You may remove boot periodically. Perform daily dressing changes as noted below.  Wash the liner of the boot regularly and wear a sock when wearing the boot. It is recommended that you sleep in the boot until told otherwise    Call office for the  following: ? Temperature greater than 101F ? Persistent nausea and vomiting ? Severe uncontrolled pain ? Redness, tenderness, or signs of infection (pain, swelling, redness, odor or green/yellow discharge around the site) ? Difficulty breathing, headache or visual disturbances ? Hives ? Persistent dizziness or light-headedness ? Extreme fatigue ? Any  other questions or concerns you may have after discharge  In an emergency, call 911 or go to an Emergency Department at a nearby hospital  HELPFUL INFORMATION  ? If you had a block, it will wear off between 8-24 hrs postop typically.  This is period when your pain may go from nearly zero to the pain you would have had postop without the block.  This is an abrupt transition but nothing dangerous is happening.  You may take an extra dose of narcotic when this happens.  ? You should wean off your narcotic medicines as soon as you are able.  Most patients will be off or using minimal narcotics before their first postop appointment.   ? We suggest you use the pain medication the first night prior to going to bed, in order to ease any pain when the anesthesia wears off. You should avoid taking pain medications on an empty stomach as it will make you nauseous.  ? Do not drink alcoholic beverages or take illicit drugs when taking pain medications.  ? In most states it is against the law to drive while you are in a splint or sling.  And certainly against the law to drive while taking narcotics.  ? You may return to work/school in the next couple of days when you feel up to it.   ? Pain medication may make you constipated.  Below are a few solutions to try in this order:   ? Decrease the amount of pain medication if you aren't having pain.   ? Drink lots of decaffeinated fluids.   ? Drink prune juice and/or each dried prunes   o If the first 3 don't work start with additional solutions   ? Take Colace - an over-the-counter stool softener   ? Take  Senokot - an over-the-counter laxative   ? Take Miralax - a stronger over-the-counter laxative     CALL THE OFFICE WITH ANY QUESTIONS OR CONCERNS: [phone]   VISIT OUR WEBSITE FOR ADDITIONAL INFORMATION: [email or web address]   Driving restrictions   Complete by: As directed    No driving until further notice   Increase activity slowly as tolerated   Complete by: As directed    Partial weight bearing   Complete by: As directed    % Body Weight: 50   Laterality: right   Post-operative opioid taper instructions:   Complete by: As directed    POST-OPERATIVE OPIOID TAPER INSTRUCTIONS: It is important to wean off of your opioid medication as soon as possible. If you do not need pain medication after your surgery it is ok to stop day one. Opioids include: Codeine, Hydrocodone(Norco, Vicodin), Oxycodone(Percocet, oxycontin) and hydromorphone amongst others.  Long term and even short term use of opiods can cause: Increased pain response Dependence Constipation Depression Respiratory depression And more.  Withdrawal symptoms can include Flu like symptoms Nausea, vomiting And more Techniques to manage these symptoms Hydrate well Eat regular healthy meals Stay active Use relaxation techniques(deep breathing, meditating, yoga) Do Not substitute Alcohol to help with tapering If you have been on opioids for less than two weeks and do not have pain than it is ok to stop all together.  Plan to wean off of opioids This plan should start within one week post op of your joint replacement. Maintain the same interval or time between taking each dose and first decrease the dose.  Cut the total daily intake of opioids by one tablet each day Next start to increase the  time between doses. The last dose that should be eliminated is the evening dose.         Allergies as of 11/26/2022       Reactions   Lamictal [lamotrigine] Other (See Comments)   Levonne Spiller Syndrome, 2014         Medication List     TAKE these medications    acetaminophen 325 MG tablet Commonly known as: TYLENOL Take 2 tablets (650 mg total) by mouth every 12 (twelve) hours as needed for mild pain.   docusate sodium 100 MG capsule Commonly known as: COLACE Take 1 capsule (100 mg total) by mouth 2 (two) times daily.   hydrOXYzine 25 MG capsule Commonly known as: Vistaril Take 1 capsule (25 mg total) by mouth every 8 (eight) hours as needed for itching.   methocarbamol 500 MG tablet Commonly known as: ROBAXIN Take 1-2 tablets (500-1,000 mg total) by mouth every 6 (six) hours as needed for muscle spasms.   oxyCODONE-acetaminophen 7.5-325 MG tablet Commonly known as: Percocet Take 1-2 tablets by mouth every 6 (six) hours as needed for severe pain.   Rivaroxaban 15 MG Tabs tablet Commonly known as: XARELTO Take 1 tablet (15 mg total) by mouth daily with supper.   Zepbound 2.5 MG/0.5ML Pen Generic drug: tirzepatide Inject 2.5 mg into the skin once a week.               Discharge Care Instructions  (From admission, onward)           Start     Ordered   11/26/22 0000  Partial weight bearing       Question Answer Comment  % Body Weight 50   Laterality right      11/26/22 1052            Follow-up Information     [facility]. Go on 12/10/2022.   Why: 2:15 pm with Dr.Ingle, hospital follow and to establish primary care Contact information: [address] [place] [address] [phone]        Myrene Galas, MD. Schedule an appointment as soon as possible for a visit in 10 day(s).   Specialty: Orthopedic Surgery Contact information: [address] [place] [address] [phone]                 Discharge Instructions and Plan:  42 year old male s/p intramedullary nailing of right peritrochanteric femur fracture  Weightbearing: PWB 50% of right leg   Insicional and dressing care: Daily dressing  changes with Mepilex/silicone foam dressing or 4 x 4 gauze and tape starting on 11/29/2022 Orthopedic device(s):  Walker or crutches Showering: Okay to shower and clean wounds with soap and water once there is no drainage from his incisions VTE prophylaxis: Xarelto 15 mg daily for the next 30 days Pain control: Multimodal with Tylenol, Robaxin and Percocet Bone Health/Optimization: Continue home regimen Follow - up plan:  10 to 14 days Contact information: Myrene Galas MD, Montez Morita PA-C   Signed:  Mearl Latin, PA-C [phone] (C) 11/26/2022, 10:53 AM  [facility] [address] [place] [address] [phone] Collier Bullock (F)

## 2022-11-26 NOTE — Progress Notes (Addendum)
[facility] Progress Note  Patient ID: Chad Bray MRN: [id number] DOB/AGE: 42-Apr-1982 42 y.o.  Subjective:  Doing well Pain improved No complaints Wants to go home today    ROS As above  Objective:   VITALS:   Vitals:   11/25/22 1330 11/25/22 1944 11/26/22 0412 11/26/22 0715  BP: 115/72 113/65 106/64 112/66  Pulse: 83 96 91   Resp: 16 16 16 17   Temp: 98.4 F (36.9 C) 99 F (37.2 C)  97.9 F (36.6 C)  TempSrc: Oral Oral  Oral  SpO2: 98% 98% 93% 97%  Weight:      Height:        Estimated body mass index is 26.46 kg/m as calculated from the following:   Height as of this encounter: 6' (1.829 m).   Weight as of this encounter: 88.5 kg.   Intake/Output      08/06 0701 08/07 0700 08/07 0701 08/08 0700   P.O. 500    I.V. (mL/kg)     IV Piggyback     Total Intake(mL/kg) 500 (5.6)    Urine (mL/kg/hr) 390 (0.2)    Blood     Total Output 390    Net +110           LABS  Results for orders placed or performed during the hospital encounter of 11/23/22 (from the past 24 hour(s))  Basic metabolic panel     Status: Abnormal   Collection Time: 11/25/22 10:55 AM  Result Value Ref Range   Sodium 131 (L) 135 - 145 mmol/L   Potassium 3.7 3.5 - 5.1 mmol/L   Chloride 94 (L) 98 - 111 mmol/L   CO2 31 22 - 32 mmol/L   Glucose, Bld 117 (H) 70 - 99 mg/dL   BUN 5 (L) 6 - 20 mg/dL   Creatinine, Ser 4.09 0.61 - 1.24 mg/dL   Calcium 8.3 (L) 8.9 - 10.3 mg/dL   GFR, Estimated >81 >19 mL/min   Anion gap 6 5 - 15  CBC     Status: Abnormal   Collection Time: 11/26/22 12:38 AM  Result Value Ref Range   WBC 5.9 4.0 - 10.5 K/uL   RBC 3.11 (L) 4.22 - 5.81 MIL/uL   Hemoglobin 10.2 (L) 13.0 - 17.0 g/dL   HCT 14.7 (L) 82.9 - 56.2 %   MCV 94.2 80.0 - 100.0 fL   MCH 32.8 26.0 - 34.0 pg   MCHC 34.8 30.0 - 36.0 g/dL   RDW 13.0 86.5 - 78.4 %   Platelets 147 (L) 150 - 400 K/uL   nRBC 0.0 0.0  - 0.2 %     PHYSICAL EXAM:   Gen: awake, resting comfortably in bed, NAD Lungs: unlabored Cardiac: reg Abd: + BS, NTND Ext:       Right Lower Extremity              Dressings R thigh are stable   All dressings removed   Incisions look great    No signs of infection              Ext warm              Moderate swelling to thigh but soft             Mild knee effusion  No tenderness over patella, quad tendon, patella tendon              Ext warm             Can perform active knee flexion and extension             +quad set but no SLR due to hip pain   Much improved ability to do heel slides             DPN, SPN, TN sensation intact             Ankle flexion, extension, inversion and eversion intact             No DCT             Compartments are soft   Assessment/Plan: 2 Days Post-Op   Principal Problem:   Closed intertrochanteric fracture of hip, right, initial encounter (HCC)   Anti-infectives (From admission, onward)    Start     Dose/Rate Route Frequency Ordered Stop   11/24/22 2200  ceFAZolin (ANCEF) IVPB 2g/100 mL premix        2 g 200 mL/hr over 30 Minutes Intravenous Every 6 hours 11/24/22 1821 11/25/22 0543   11/24/22 1200  ceFAZolin (ANCEF) IVPB 2g/100 mL premix  Status:  Discontinued        2 g 200 mL/hr over 30 Minutes Intravenous On call to O.R. 11/24/22 1156 11/24/22 1222   11/24/22 1030  ceFAZolin (ANCEF) IVPB 2g/100 mL premix        2 g 200 mL/hr over 30 Minutes Intravenous On call to O.R. 11/24/22 4010 11/24/22 1405     .  POD/HD#:2  42 y/o male s/p fall off about 6 ft with R pertrochanteric fracture   -R pertrochanteric femur fracture s/p IMN  Weightbearing PWB R leg---> 50% x 4 weeks then WBAT Use crutches or walker                ROM/Activity                         Unrestricted ROM R hip and knee                         Increase activity as tolerated while maintaining WB restrictions               Wound care                          Dressings changed today.  New Mepilex is applied.  Patient to change again 48 to 72 hours.  Leave open to air once there is no drainage and can clean with soap and water only.  He may only shower no submerging in bathtub or pool               Ice prn              Elevate for swelling and pain control             Do not let knee rest in flexion                PT- please teach HEP for R knee ROM- AROM, PROM. Prone exercises as well. No ROM restrictions.  Quad sets, SLR, LAQ, SAQ, heel slides, stretching, prone flexion and extension   Ankle theraband program, heel cord stretching, toe towel  curls, etc   No pillows under bend of knee when at rest, ok to place under heel to help work on extension. Can also use zero knee bone foam if available DO NOT LET KNEE REST IN FLEXION!!!!!                 Discussed knee effusion with pt and aspiration yield. Given that he has no current knee symptoms and has ambulated without knee pain we will hold on MRI for now      - Pain management:             Multimodal             Favor PO meds over IV    - ABL anemia/Hemodynamics             Stable   - Medical issues              No chronic issues   - DVT/PE prophylaxis:           Xarelto for 30 days of discharge - ID:              Periop abx complete    - Metabolic Bone Disease:             Vitamin d levels look ok              Given clinical feel of bone and current labs obtained do not feel additional workup is warranted   - Activity:             As above   - FEN/GI prophylaxis/Foley/Lines:             Reg diet    - Dispo:             Discharge home today  Will refer to outpatient therapy at his first postoperative follow-up visit.  Anticipate him being out of work for about 3 to 4 weeks       Mearl Latin, PA-C [phone] (C) 11/26/2022, 10:40 AM  [facility] [address] [place] [address] [phone] Collier Bullock (F)    After 5pm  and on the weekends please log on to Amion, go to orthopaedics and the look under the [facility] Call for the provider(s) on call. You can also call our office at [phone] and then follow the prompts to be connected to the call team.  Patient ID: Chad Bray, male   DOB: April 30, 1980, 42 y.o.   MRN: [id number]

## 2022-12-10 ENCOUNTER — Ambulatory Visit: Payer: 59 | Admitting: Student

## 2022-12-10 ENCOUNTER — Encounter: Payer: Self-pay | Admitting: Orthopedic Surgery

## 2023-01-01 ENCOUNTER — Encounter (HOSPITAL_BASED_OUTPATIENT_CLINIC_OR_DEPARTMENT_OTHER): Payer: Self-pay | Admitting: Physical Therapy

## 2023-01-01 ENCOUNTER — Ambulatory Visit (HOSPITAL_BASED_OUTPATIENT_CLINIC_OR_DEPARTMENT_OTHER): Payer: 59 | Attending: Orthopedic Surgery | Admitting: Physical Therapy

## 2023-01-01 DIAGNOSIS — W1830XA Fall on same level, unspecified, initial encounter: Secondary | ICD-10-CM | POA: Diagnosis not present

## 2023-01-01 DIAGNOSIS — S72141A Displaced intertrochanteric fracture of right femur, initial encounter for closed fracture: Secondary | ICD-10-CM

## 2023-01-01 DIAGNOSIS — R2689 Other abnormalities of gait and mobility: Secondary | ICD-10-CM | POA: Diagnosis not present

## 2023-01-01 DIAGNOSIS — M6281 Muscle weakness (generalized): Secondary | ICD-10-CM

## 2023-01-01 DIAGNOSIS — R29898 Other symptoms and signs involving the musculoskeletal system: Secondary | ICD-10-CM

## 2023-01-01 DIAGNOSIS — M79604 Pain in right leg: Secondary | ICD-10-CM | POA: Diagnosis present

## 2023-01-01 NOTE — Therapy (Signed)
OUTPATIENT PHYSICAL THERAPY LOWER EXTREMITY EVALUATION   Patient Name: Chad Bray MRN: [id number] DOB:10-29-1980, 42 y.o., male Today's Date: 01/01/2023  END OF SESSION:  PT End of Session - 01/01/23 1258     Visit Number 1    Number of Visits 16    Date for PT Re-Evaluation 02/26/23    Authorization Type [employer]    PT Start Time 1300    PT Stop Time 1345    PT Time Calculation (min) 45 min    Activity Tolerance Patient tolerated treatment well    Behavior During Therapy WFL for tasks assessed/performed             Past Medical History:  Diagnosis Date   GERD (gastroesophageal reflux disease)    Stevens-Johnson syndrome (HCC)    lamictal   Past Surgical History:  Procedure Laterality Date   FEMUR IM NAIL Right 11/24/2022   Procedure: INTRAMEDULLARY (IM) NAIL FEMORAL;  Surgeon: Myrene Galas, MD;  Location: [facility] OR;  Service: Orthopedics;  Laterality: Right;   tibia and fibula surgery Left    2023   Patient Active Problem List   Diagnosis Date Noted   Closed intertrochanteric fracture of hip, right, initial encounter (HCC) 11/23/2022    PCP: No PCP  REFERRING PROVIDER: Myrene Galas, MD  REFERRING DIAG: S72.141A (ICD-10-CM) - Closed displaced intertrochanteric fracture of right femur, initial encounter (HCC)  THERAPY DIAG:  Pain in right leg  Muscle weakness (generalized)  Other abnormalities of gait and mobility  Other symptoms and signs involving the musculoskeletal system  Closed displaced intertrochanteric fracture of right femur, initial encounter [facility])  Rationale for Evaluation and Treatment: Rehabilitation  ONSET DATE: DOS 11/24/22  SUBJECTIVE:   SUBJECTIVE STATEMENT: Patient states fell over fence breaking hip. Has been using one crutch to walk. Was using no crutch for day and was fatigued next day. 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 Hx left tib/fib  PERTINENT HISTORY: admitted on 11/23/2022 with  comminuted displaced right peritrochanteric hip fracture. DOS 11/24/22 - IM femoral nail PAIN:  Are you having pain? No  PRECAUTIONS: None  WEIGHT BEARING RESTRICTIONS: No  FALLS:  Has patient fallen in last 6 months? Yes. 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  PLOF: Independent  PATIENT GOALS: improve walking, strength, mobility   OBJECTIVE: (objective measures from initial evaluation unless otherwise dated)   PATIENT SURVEYS: FOTO complete next session  COGNITION: Overall cognitive status: Within functional limits for tasks assessed     SENSATION: WFL   PALPATION: WFL medial/lateral patellar glides  LOWER EXTREMITY ROM:  Active ROM Right eval Left eval  Hip flexion    Hip extension    Hip abduction    Hip adduction    Hip internal rotation    Hip external rotation    Knee flexion 145   Knee extension 0   Ankle dorsiflexion    Ankle plantarflexion    Ankle inversion    Ankle eversion     (Blank rows = not tested) *= pain/symptoms  LOWER EXTREMITY MMT:  MMT Right eval Left eval  Hip flexion 56.2 49.9  Hip extension    Hip abduction 52.2 62.8  Hip adduction    Hip internal rotation    Hip external rotation    Knee flexion    Knee extension 44.3 99  Ankle dorsiflexion    Ankle plantarflexion    Ankle inversion    Ankle eversion     (  Blank rows = not tested) *= pain/symptoms   FUNCTIONAL TESTS:  Squat: weight shift off RLE  Steps: able to complete 7 inch with alternating pattern, decreased concentric and eccentric R quad/glute strength and motor control    GAIT: Distance walked: 100 feet Assistive device utilized: Crutches Level of assistance: Modified independence Comments: ambulates with trendelenburg   TODAY'S TREATMENT:                                                                                                                              DATE:  01/01/23 Squat 1 x 10  Hip abduction 1 x  10 Hip extension 1 x 10    PATIENT EDUCATION:  Education details: Patient educated on exam findings, POC, scope of PT, HEP. Person educated: Patient Education method: Explanation, Demonstration, and Handouts Education comprehension: verbalized understanding, returned demonstration, verbal cues required, and tactile cues required  HOME EXERCISE PROGRAM: Access Code: [email or web address] URL: [email or web address]   Date: 01/01/2023 - Squat  - 1 x daily - 7 x weekly - 3 sets - 10 reps - Sidelying Hip Abduction  - 1 x daily - 7 x weekly - 3 sets - 10 reps - Prone Hip Extension  - 1 x daily - 7 x weekly - 3 sets - 10 reps  ASSESSMENT:  CLINICAL IMPRESSION: Patient a 42 y.o. y.o. male who was seen today for physical therapy evaluation and treatment for R femur fracture. Patient presents with pain limited deficits in RLE strength, ROM, endurance, activity tolerance, gait, balance, and functional mobility with ADL. Patient is having to modify and restrict ADL as indicated by outcome measure score as well as subjective information and objective measures which is affecting overall participation. Patient will benefit from skilled physical therapy in order to improve function and reduce impairment.  OBJECTIVE IMPAIRMENTS: Abnormal gait, decreased activity tolerance, decreased balance, decreased endurance, decreased mobility, difficulty walking, decreased ROM, decreased strength, impaired flexibility, improper body mechanics, and pain.   ACTIVITY LIMITATIONS: carrying, lifting, bending, standing, squatting, stairs, transfers, locomotion level, and caring for others  PARTICIPATION LIMITATIONS: meal prep, cleaning, laundry, shopping, community activity, occupation, and yard work  PERSONAL FACTORS: Profession are also affecting patient's functional outcome.   REHAB POTENTIAL: Good  CLINICAL DECISION MAKING: Stable/uncomplicated  EVALUATION COMPLEXITY: Low   GOALS: Goals reviewed with  patient? Yes  SHORT TERM GOALS: Target date: 01/29/2023    Patient will be independent with HEP in order to improve functional outcomes. Baseline: Goal status: INITIAL  2.  Patient will report at least 25% improvement in symptoms for improved quality of life. Baseline: Goal status: INITIAL    LONG TERM GOALS: Target date: 02/26/2023    Patient will report at least 75% improvement in symptoms for improved quality of life. Baseline:  Goal status: INITIAL  2.  Patient will improve FOTO score to predicted outcomes  in order to indicate improved tolerance to activity. Baseline:  Goal status: INITIAL  3.  Patient will  be able to navigate stairs with reciprocal pattern without compensation in order to demonstrate improved LE strength. Baseline:  Goal status: INITIAL  4.  Patient will be able to complete forward step down test without deviation for improved functional strength.  Baseline:  Goal status: INITIAL  5.  Patient will be able to return to all activities unrestricted for improved ability to perform work functions and participate with friends. Baseline:  Goal status: INITIAL  6.  Patient will demonstrate improved strength of at least 10 lbs in all tested musculature as evidence of improved strength to assist with pit crew duties. Baseline:  Goal status: INITIAL   PLAN:  PT FREQUENCY: 2x/week  PT DURATION: 8 weeks  PLANNED INTERVENTIONS: Therapeutic exercises, Therapeutic activity, Neuromuscular re-education, Balance training, Gait training, Patient/Family education, Joint manipulation, Joint mobilization, Stair training, Orthotic/Fit training, DME instructions, Aquatic Therapy, Dry Needling, Electrical stimulation, Spinal manipulation, Spinal mobilization, Cryotherapy, Moist heat, Compression bandaging, scar mobilization, Splintting, Taping, Traction, Ultrasound, Ionotophoresis 4mg /ml Dexamethasone, and Manual therapy  PLAN FOR NEXT SESSION: f/u with HEP, complete  FOTO. R quad and hip strength, begin with table/functional, progress to machines and higher level mobility as able   Wyman Songster, PT 01/01/2023, 3:24 PM

## 2023-01-08 ENCOUNTER — Ambulatory Visit (HOSPITAL_BASED_OUTPATIENT_CLINIC_OR_DEPARTMENT_OTHER): Payer: 59 | Admitting: Physical Therapy

## 2023-01-08 DIAGNOSIS — R2689 Other abnormalities of gait and mobility: Secondary | ICD-10-CM

## 2023-01-08 DIAGNOSIS — M79604 Pain in right leg: Secondary | ICD-10-CM | POA: Diagnosis not present

## 2023-01-08 DIAGNOSIS — S72141A Displaced intertrochanteric fracture of right femur, initial encounter for closed fracture: Secondary | ICD-10-CM

## 2023-01-08 DIAGNOSIS — R29898 Other symptoms and signs involving the musculoskeletal system: Secondary | ICD-10-CM

## 2023-01-08 DIAGNOSIS — M6281 Muscle weakness (generalized): Secondary | ICD-10-CM

## 2023-01-08 NOTE — Therapy (Signed)
OUTPATIENT PHYSICAL THERAPY LOWER EXTREMITY  Treatment   Patient Name: Chad Bray MRN: [id number] DOB:1980-08-14, 42 y.o., male Today's Date: 01/08/2023  END OF SESSION:  PT End of Session - 01/08/23 1344     Visit Number 2    Number of Visits 16    Date for PT Re-Evaluation 02/26/23    Authorization Type [employer]    PT Start Time 1233    PT Stop Time 1312    PT Time Calculation (min) 39 min    Activity Tolerance Patient tolerated treatment well    Behavior During Therapy WFL for tasks assessed/performed              Past Medical History:  Diagnosis Date   GERD (gastroesophageal reflux disease)    Stevens-Johnson syndrome (HCC)    lamictal   Past Surgical History:  Procedure Laterality Date   FEMUR IM NAIL Right 11/24/2022   Procedure: INTRAMEDULLARY (IM) NAIL FEMORAL;  Surgeon: Myrene Galas, MD;  Location: [facility] OR;  Service: Orthopedics;  Laterality: Right;   tibia and fibula surgery Left    2023   Patient Active Problem List   Diagnosis Date Noted   Closed intertrochanteric fracture of hip, right, initial encounter (HCC) 11/23/2022    PCP: No PCP  REFERRING PROVIDER: Myrene Galas, MD  REFERRING DIAG: S72.141A (ICD-10-CM) - Closed displaced intertrochanteric fracture of right femur, initial encounter (HCC)  THERAPY DIAG:  Pain in right leg  Muscle weakness (generalized)  Other abnormalities of gait and mobility  Other symptoms and signs involving the musculoskeletal system  Closed displaced intertrochanteric fracture of right femur, initial encounter (HCC)  Rationale for Evaluation and Treatment: Rehabilitation  ONSET DATE: DOS 11/24/22  SUBJECTIVE:   SUBJECTIVE STATEMENT: Patient hiked a mile on a trail with his cane and was sore    Eval:Patient states fell over fence breaking hip. Has been using one crutch to walk. Was using no crutch for day and was fatigued next day. 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 Hx left tib/fib  PERTINENT HISTORY: admitted on 11/23/2022 with comminuted displaced right peritrochanteric hip fracture. DOS 11/24/22 - IM femoral nail PAIN:  Are you having pain? No  PRECAUTIONS: None  WEIGHT BEARING RESTRICTIONS: No  FALLS:  Has patient fallen in last 6 months? Yes. 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  PLOF: Independent  PATIENT GOALS: improve walking, strength, mobility   OBJECTIVE: (objective measures from initial evaluation unless otherwise dated)   PATIENT SURVEYS: FOTO complete next session  COGNITION: Overall cognitive status: Within functional limits for tasks assessed     SENSATION: WFL   PALPATION: WFL medial/lateral patellar glides  LOWER EXTREMITY ROM:  Active ROM Right eval Left eval  Hip flexion    Hip extension    Hip abduction    Hip adduction    Hip internal rotation    Hip external rotation    Knee flexion 145   Knee extension 0   Ankle dorsiflexion    Ankle plantarflexion    Ankle inversion    Ankle eversion     (Blank rows = not tested) *= pain/symptoms  LOWER EXTREMITY MMT:  MMT Right eval Left eval  Hip flexion 56.2 49.9  Hip extension    Hip abduction 52.2 62.8  Hip adduction    Hip internal rotation    Hip external rotation    Knee flexion    Knee extension 44.3 99  Ankle dorsiflexion    Ankle plantarflexion    Ankle inversion    Ankle eversion     (Blank rows = not tested) *= pain/symptoms   FUNCTIONAL TESTS:  Squat: weight shift off RLE  Steps: able to complete 7 inch with alternating pattern, decreased concentric and eccentric R quad/glute strength and motor control    GAIT: Distance walked: 100 feet Assistive device utilized: Crutches Level of assistance: Modified independence Comments: ambulates with trendelenburg   TODAY'S TREATMENT:                                                                                                                               DATE:  SLR:  1st set x10 RPE of 3  2nd set x20 RPE of 5. We will likley have to load it next visit.   SL SLR  2x20   Bridging with band  X20 blue RPE of 6   Standing Slow March   2x15 RPE of 6         1st set    01/01/23 Squat 1 x 10  Hip abduction 1 x 10 Hip extension 1 x 10    PATIENT EDUCATION:  Education details: Patient educated on exam findings, POC, scope of PT, HEP. Person educated: Patient Education method: Explanation, Demonstration, and Handouts Education comprehension: verbalized understanding, returned demonstration, verbal cues required, and tactile cues required  HOME EXERCISE PROGRAM: Access Code: [email or web address] URL: [email or web address]   Date: 01/01/2023 - Squat  - 1 x daily - 7 x weekly - 3 sets - 10 reps - Sidelying Hip Abduction  - 1 x daily - 7 x weekly - 3 sets - 10 reps - Prone Hip Extension  - 1 x daily - 7 x weekly - 3 sets - 10 reps  ASSESSMENT:  CLINICAL IMPRESSION: The patient is making good progress. We worked on grading his exercises to give hi a challenge but not put too much stress on his surgical site. He has had some trouble in his knee lately. We focused moreon straight leg activity because of his knee. He will get a cuff weight for home. We can think about loading his SLR and SL SLR next visit if he does well. He required progressively less support with slow march as he practiced. We reviewed and updated HEP.    Eval:Patient a 42 y.o. y.o. male who was seen today for physical therapy evaluation and treatment for R femur fracture. Patient presents with pain limited deficits in RLE strength, ROM, endurance, activity tolerance, gait, balance, and functional mobility with ADL. Patient is having to modify and restrict ADL as indicated by outcome measure score as well as subjective information and objective measures which is affecting overall participation. Patient will benefit from skilled physical therapy  in order to improve function and reduce impairment.  OBJECTIVE IMPAIRMENTS: Abnormal gait, decreased activity tolerance, decreased balance, decreased endurance, decreased mobility, difficulty walking, decreased ROM, decreased strength, impaired flexibility, improper  body mechanics, and pain.   ACTIVITY LIMITATIONS: carrying, lifting, bending, standing, squatting, stairs, transfers, locomotion level, and caring for others  PARTICIPATION LIMITATIONS: meal prep, cleaning, laundry, shopping, community activity, occupation, and yard work  PERSONAL FACTORS: Profession are also affecting patient's functional outcome.   REHAB POTENTIAL: Good  CLINICAL DECISION MAKING: Stable/uncomplicated  EVALUATION COMPLEXITY: Low   GOALS: Goals reviewed with patient? Yes  SHORT TERM GOALS: Target date: 01/29/2023    Patient will be independent with HEP in order to improve functional outcomes. Baseline: Goal status: INITIAL  2.  Patient will report at least 25% improvement in symptoms for improved quality of life. Baseline: Goal status: INITIAL    LONG TERM GOALS: Target date: 02/26/2023    Patient will report at least 75% improvement in symptoms for improved quality of life. Baseline:  Goal status: INITIAL  2.  Patient will improve FOTO score to predicted outcomes  in order to indicate improved tolerance to activity. Baseline:  Goal status: INITIAL  3.  Patient will be able to navigate stairs with reciprocal pattern without compensation in order to demonstrate improved LE strength. Baseline:  Goal status: INITIAL  4.  Patient will be able to complete forward step down test without deviation for improved functional strength.  Baseline:  Goal status: INITIAL  5.  Patient will be able to return to all activities unrestricted for improved ability to perform work functions and participate with friends. Baseline:  Goal status: INITIAL  6.  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 Baseline:  Goal status: INITIAL   PLAN:  PT FREQUENCY: 2x/week  PT DURATION: 8 weeks  PLANNED INTERVENTIONS: Therapeutic exercises, Therapeutic activity, Neuromuscular re-education, Balance training, Gait training, Patient/Family education, Joint manipulation, Joint mobilization, Stair training, Orthotic/Fit training, DME instructions, Aquatic Therapy, Dry Needling, Electrical stimulation, Spinal manipulation, Spinal mobilization, Cryotherapy, Moist heat, Compression bandaging, scar mobilization, Splintting, Taping, Traction, Ultrasound, Ionotophoresis 4mg /ml Dexamethasone, and Manual therapy  PLAN FOR NEXT SESSION: f/u with HEP, complete FOTO. R quad and hip strength, begin with table/functional, progress to machines and higher level mobility as able   Dessie Coma, PT 01/08/2023, 2:31 PM

## 2023-01-15 ENCOUNTER — Telehealth (HOSPITAL_BASED_OUTPATIENT_CLINIC_OR_DEPARTMENT_OTHER): Payer: Self-pay

## 2023-01-15 ENCOUNTER — Ambulatory Visit (HOSPITAL_BASED_OUTPATIENT_CLINIC_OR_DEPARTMENT_OTHER): Payer: 59

## 2023-01-15 NOTE — Telephone Encounter (Signed)
Called pt and left VM regarding missed appt. Reminded pt of next scheduled visit.

## 2023-01-19 ENCOUNTER — Ambulatory Visit (HOSPITAL_BASED_OUTPATIENT_CLINIC_OR_DEPARTMENT_OTHER): Payer: 59 | Admitting: Physical Therapy

## 2023-01-19 ENCOUNTER — Encounter (HOSPITAL_BASED_OUTPATIENT_CLINIC_OR_DEPARTMENT_OTHER): Payer: Self-pay | Admitting: Physical Therapy

## 2023-01-19 DIAGNOSIS — M6281 Muscle weakness (generalized): Secondary | ICD-10-CM

## 2023-01-19 DIAGNOSIS — R2689 Other abnormalities of gait and mobility: Secondary | ICD-10-CM

## 2023-01-19 DIAGNOSIS — M79604 Pain in right leg: Secondary | ICD-10-CM | POA: Diagnosis not present

## 2023-01-19 DIAGNOSIS — S72141A Displaced intertrochanteric fracture of right femur, initial encounter for closed fracture: Secondary | ICD-10-CM

## 2023-01-19 DIAGNOSIS — R29898 Other symptoms and signs involving the musculoskeletal system: Secondary | ICD-10-CM

## 2023-01-19 NOTE — Therapy (Signed)
OUTPATIENT PHYSICAL THERAPY LOWER EXTREMITY  Treatment   Patient Name: Chad Bray MRN: [id number] DOB:06/02/1980, 42 y.o., male Today's Date: 01/19/2023  END OF SESSION:  PT End of Session - 01/19/23 1240     Visit Number 3    Number of Visits 16    Date for PT Re-Evaluation 02/26/23    Authorization Type [employer]    PT Start Time 1238    PT Stop Time 1318    PT Time Calculation (min) 40 min    Activity Tolerance Patient tolerated treatment well    Behavior During Therapy WFL for tasks assessed/performed               Past Medical History:  Diagnosis Date   GERD (gastroesophageal reflux disease)    Stevens-Johnson syndrome (HCC)    lamictal   Past Surgical History:  Procedure Laterality Date   FEMUR IM NAIL Right 11/24/2022   Procedure: INTRAMEDULLARY (IM) NAIL FEMORAL;  Surgeon: Myrene Galas, MD;  Location: [facility] OR;  Service: Orthopedics;  Laterality: Right;   tibia and fibula surgery Left    2023   Patient Active Problem List   Diagnosis Date Noted   Closed intertrochanteric fracture of hip, right, initial encounter (HCC) 11/23/2022    PCP: No PCP  REFERRING PROVIDER: Myrene Galas, MD  REFERRING DIAG: S72.141A (ICD-10-CM) - Closed displaced intertrochanteric fracture of right femur, initial encounter (HCC)  THERAPY DIAG:  Pain in right leg  Muscle weakness (generalized)  Other abnormalities of gait and mobility  Other symptoms and signs involving the musculoskeletal system  Closed displaced intertrochanteric fracture of right femur, initial encounter (HCC)  Rationale for Evaluation and Treatment: Rehabilitation  ONSET DATE: DOS 11/24/22  SUBJECTIVE:   SUBJECTIVE STATEMENT: The patient was able to hike 1.5 miles without much pain. He has been having some medial pain of the quad but otherwise he has been doing very well.    Eval:Patient states fell over fence breaking hip. Has been using one crutch to walk. Was using no crutch for  day and was fatigued next day. 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 Hx left tib/fib  PERTINENT HISTORY: admitted on 11/23/2022 with comminuted displaced right peritrochanteric hip fracture. DOS 11/24/22 - IM femoral nail PAIN:  Are you having pain? No  PRECAUTIONS: None  WEIGHT BEARING RESTRICTIONS: No  FALLS:  Has patient fallen in last 6 months? Yes. 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  PLOF: Independent  PATIENT GOALS: improve walking, strength, mobility   OBJECTIVE: (objective measures from initial evaluation unless otherwise dated)   PATIENT SURVEYS: FOTO complete next session  COGNITION: Overall cognitive status: Within functional limits for tasks assessed     SENSATION: WFL   PALPATION: WFL medial/lateral patellar glides  LOWER EXTREMITY ROM:  Active ROM Right eval Left eval  Hip flexion    Hip extension    Hip abduction    Hip adduction    Hip internal rotation    Hip external rotation    Knee flexion 145   Knee extension 0   Ankle dorsiflexion    Ankle plantarflexion    Ankle inversion    Ankle eversion     (Blank rows = not tested) *= pain/symptoms  LOWER EXTREMITY MMT:  MMT Right eval Left eval  Hip flexion 56.2 49.9  Hip extension    Hip abduction 52.2 62.8  Hip adduction    Hip internal rotation  Hip external rotation    Knee flexion    Knee extension 44.3 99  Ankle dorsiflexion    Ankle plantarflexion    Ankle inversion    Ankle eversion     (Blank rows = not tested) *= pain/symptoms   FUNCTIONAL TESTS:  Squat: weight shift off RLE  Steps: able to complete 7 inch with alternating pattern, decreased concentric and eccentric R quad/glute strength and motor control    GAIT: Distance walked: 100 feet Assistive device utilized: Crutches Level of assistance: Modified independence Comments: ambulates with trendelenburg   TODAY'S TREATMENT:                                                                                                                               DATE:  9/30  SLR 2.5 lbs 3x10   LAQ 2 lbs 3x15   SL SLR x12 x20 x15 2.5 lbs   Step and hold onto air-ex x15 fwd  X15 lateral   Trigger Point Dry-Needling  Treatment instructions: Expect mild to moderate muscle soreness. S/S of pneumothorax if dry needled over a lung field, and to seek immediate medical attention should they occur. Patient verbalized understanding of these instructions and education.  Patient Consent Given: Yes Education handout provided: Yes Muscles treated:  left lateral quad 3x using a .30x50 needle  Electrical stimulation performed: No Parameters: N/A Treatment response/outcome: great twit   Last visit   SLR:  1st set x10 RPE of 3  2nd set x20 RPE of 5. We will likley have to load it next visit.   SL SLR  2x20   Bridging with band  X20 blue RPE of 6   Standing Slow March   2x15 RPE of 6   Trigger Point Dry-Needling  Treatment instructions: Expect mild to moderate muscle soreness. S/S of pneumothorax if dry needled over a lung field, and to seek immediate medical attention should they occur. Patient verbalized understanding of these instructions and education.  Patient Consent Given: Yes Education handout provided: Yes Muscles treated:  left lateral quad 3x using a .30x50 needle  Electrical stimulation performed: No Parameters: N/A Treatment response/outcome: great twitch         1st set    01/01/23 Squat 1 x 10  Hip abduction 1 x 10 Hip extension 1 x 10    PATIENT EDUCATION:  Education details: Patient educated on exam findings, POC, scope of PT, HEP. Person educated: Patient Education method: Explanation, Demonstration, and Handouts Education comprehension: verbalized understanding, returned demonstration, verbal cues required, and tactile cues required  HOME EXERCISE PROGRAM: Access Code: [email or web address] URL:  [email or web address]   Date: 01/01/2023 - Squat  - 1 x daily - 7 x weekly - 3 sets - 10 reps - Sidelying Hip Abduction  - 1 x daily - 7 x weekly - 3 sets - 10 reps - Prone Hip Extension  - 1 x daily - 7 x weekly - 3 sets - 10 reps  ASSESSMENT:  CLINICAL IMPRESSION: Patient continues to progress well.  He reports he has had some medial thigh pain.  We appear to have some trigger points in his inferior medial quad area.  He had a great twitch with trigger point dry needling.  We continue to work on progressing weights with straight leg raises.  We have kept his RPE around 5-6.  We also worked on instability work.  Patient is working on getting back to hiking.  Worked on unstable surfaces.  He had no significant increase in pain.  Patient will continue from further skilled therapy.  Eval:Patient a 42 y.o. y.o. male who was seen today for physical therapy evaluation and treatment for R femur fracture. Patient presents with pain limited deficits in RLE strength, ROM, endurance, activity tolerance, gait, balance, and functional mobility with ADL. Patient is having to modify and restrict ADL as indicated by outcome measure score as well as subjective information and objective measures which is affecting overall participation. Patient will benefit from skilled physical therapy in order to improve function and reduce impairment.  OBJECTIVE IMPAIRMENTS: Abnormal gait, decreased activity tolerance, decreased balance, decreased endurance, decreased mobility, difficulty walking, decreased ROM, decreased strength, impaired flexibility, improper body mechanics, and pain.   ACTIVITY LIMITATIONS: carrying, lifting, bending, standing, squatting, stairs, transfers, locomotion level, and caring for others  PARTICIPATION LIMITATIONS: meal prep, cleaning, laundry, shopping, community activity, occupation, and yard work  PERSONAL FACTORS: Profession are also affecting patient's functional outcome.    REHAB POTENTIAL: Good  CLINICAL DECISION MAKING: Stable/uncomplicated  EVALUATION COMPLEXITY: Low   GOALS: Goals reviewed with patient? Yes  SHORT TERM GOALS: Target date: 01/29/2023    Patient will be independent with HEP in order to improve functional outcomes. Baseline: Goal status: INITIAL  2.  Patient will report at least 25% improvement in symptoms for improved quality of life. Baseline: Goal status: INITIAL    LONG TERM GOALS: Target date: 02/26/2023    Patient will report at least 75% improvement in symptoms for improved quality of life. Baseline:  Goal status: INITIAL  2.  Patient will improve FOTO score to predicted outcomes  in order to indicate improved tolerance to activity. Baseline:  Goal status: INITIAL  3.  Patient will be able to navigate stairs with reciprocal pattern without compensation in order to demonstrate improved LE strength. Baseline:  Goal status: INITIAL  4.  Patient will be able to complete forward step down test without deviation for improved functional strength.  Baseline:  Goal status: INITIAL  5.  Patient will be able to return to all activities unrestricted for improved ability to perform work functions and participate with friends. Baseline:  Goal status: INITIAL  6.  Patient will demonstrate improved strength of at least 10 lbs in all tested musculature as evidence of improved strength to assist with pit crew duties. Baseline:  Goal status: INITIAL   PLAN:  PT FREQUENCY: 2x/week  PT DURATION: 8 weeks  PLANNED INTERVENTIONS: Therapeutic exercises, Therapeutic activity, Neuromuscular re-education, Balance training, Gait training, Patient/Family education, Joint manipulation, Joint mobilization, Stair training, Orthotic/Fit training, DME instructions, Aquatic Therapy, Dry Needling, Electrical stimulation, Spinal manipulation, Spinal mobilization, Cryotherapy, Moist heat, Compression bandaging, scar mobilization,  Splintting, Taping, Traction, Ultrasound, Ionotophoresis 4mg /ml Dexamethasone, and Manual therapy  PLAN FOR NEXT SESSION: f/u with HEP, complete FOTO. R quad and hip strength, begin with table/functional, progress to machines and higher level mobility as able   Dessie Coma, PT 01/19/2023, 12:57 PM

## 2023-01-21 ENCOUNTER — Ambulatory Visit (HOSPITAL_BASED_OUTPATIENT_CLINIC_OR_DEPARTMENT_OTHER): Payer: 59 | Attending: Orthopedic Surgery | Admitting: Physical Therapy

## 2023-01-21 ENCOUNTER — Encounter (HOSPITAL_BASED_OUTPATIENT_CLINIC_OR_DEPARTMENT_OTHER): Payer: Self-pay | Admitting: Physical Therapy

## 2023-01-21 DIAGNOSIS — R29898 Other symptoms and signs involving the musculoskeletal system: Secondary | ICD-10-CM | POA: Insufficient documentation

## 2023-01-21 DIAGNOSIS — S72141A Displaced intertrochanteric fracture of right femur, initial encounter for closed fracture: Secondary | ICD-10-CM | POA: Insufficient documentation

## 2023-01-21 DIAGNOSIS — M6281 Muscle weakness (generalized): Secondary | ICD-10-CM | POA: Diagnosis present

## 2023-01-21 DIAGNOSIS — R2689 Other abnormalities of gait and mobility: Secondary | ICD-10-CM | POA: Diagnosis present

## 2023-01-21 DIAGNOSIS — M79604 Pain in right leg: Secondary | ICD-10-CM | POA: Diagnosis present

## 2023-01-22 ENCOUNTER — Encounter (HOSPITAL_BASED_OUTPATIENT_CLINIC_OR_DEPARTMENT_OTHER): Payer: Self-pay | Admitting: Physical Therapy

## 2023-01-22 NOTE — Therapy (Signed)
OUTPATIENT PHYSICAL THERAPY LOWER EXTREMITY  Treatment   Patient Name: Chad Bray MRN: [id number] DOB:[birth year], 42 y.o., male Today's Date: 01/22/2023  END OF SESSION:  PT End of Session - 01/21/23 1623     Visit Number 4    Number of Visits 16    Date for PT Re-Evaluation 02/26/23    Authorization Type [employer]    PT Start Time 1100    PT Stop Time 1143    PT Time Calculation (min) 43 min    Activity Tolerance Patient tolerated treatment well    Behavior During Therapy WFL for tasks assessed/performed               Past Medical History:  Diagnosis Date   GERD (gastroesophageal reflux disease)    Stevens-Johnson syndrome (HCC)    lamictal   Past Surgical History:  Procedure Laterality Date   FEMUR IM NAIL Right 11/24/2022   Procedure: INTRAMEDULLARY (IM) NAIL FEMORAL;  Surgeon: Myrene Galas, MD;  Location: [facility] OR;  Service: Orthopedics;  Laterality: Right;   tibia and fibula surgery Left    2023   Patient Active Problem List   Diagnosis Date Noted   Closed intertrochanteric fracture of hip, right, initial encounter (HCC) 11/23/2022    PCP: No PCP  REFERRING PROVIDER: Myrene Galas, MD  REFERRING DIAG: S72.141A (ICD-10-CM) - Closed displaced intertrochanteric fracture of right femur, initial encounter (HCC)  THERAPY DIAG:  Pain in right leg  Muscle weakness (generalized)  Other abnormalities of gait and mobility  Other symptoms and signs involving the musculoskeletal system  Closed displaced intertrochanteric fracture of right femur, initial encounter [facility])  Rationale for Evaluation and Treatment: Rehabilitation  ONSET DATE: DOS 11/24/22  SUBJECTIVE:   SUBJECTIVE STATEMENT: Patient reports the medial quad pain is resolved.  He is having some lateral soreness today.  Eval:Patient states fell over fence breaking hip. Has been using one crutch to walk. Was using no crutch for day and was fatigued next day. 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 Hx left tib/fib  PERTINENT HISTORY: admitted on 11/23/2022 with comminuted displaced right peritrochanteric hip fracture. DOS 11/24/22 - IM femoral nail PAIN:  Are you having pain? No  PRECAUTIONS: None  WEIGHT BEARING RESTRICTIONS: No  FALLS:  Has patient fallen in last 6 months? Yes. 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  PLOF: Independent  PATIENT GOALS: improve walking, strength, mobility   OBJECTIVE: (objective measures from initial evaluation unless otherwise dated)   PATIENT SURVEYS: FOTO complete next session  COGNITION: Overall cognitive status: Within functional limits for tasks assessed     SENSATION: WFL   PALPATION: WFL medial/lateral patellar glides  LOWER EXTREMITY ROM:  Active ROM Right eval Left eval  Hip flexion    Hip extension    Hip abduction    Hip adduction    Hip internal rotation    Hip external rotation    Knee flexion 145   Knee extension 0   Ankle dorsiflexion    Ankle plantarflexion    Ankle inversion    Ankle eversion     (Blank rows = not tested) *= pain/symptoms  LOWER EXTREMITY MMT:  MMT Right eval Left eval  Hip flexion 56.2 49.9  Hip extension    Hip abduction 52.2 62.8  Hip adduction    Hip internal rotation    Hip external rotation    Knee flexion    Knee extension  44.3 99  Ankle dorsiflexion    Ankle plantarflexion    Ankle inversion    Ankle eversion     (Blank rows = not tested) *= pain/symptoms   FUNCTIONAL TESTS:  Squat: weight shift off RLE  Steps: able to complete 7 inch with alternating pattern, decreased concentric and eccentric R quad/glute strength and motor control    GAIT: Distance walked: 100 feet Assistive device utilized: Crutches Level of assistance: Modified independence Comments: ambulates with trendelenburg   TODAY'S TREATMENT:                                                                                                                               DATE:  10/2 Ex bike: 6 min L5   SLR 2.5 lbs 3x10   Leg press LF 3x12 30 lbs  Hip abduction machine 70 lbs 3x10   Cable walk x10 20 lbs   Step down 2 inch controlled 3x10     9/30  SLR 2.5 lbs 3x10   LAQ 2 lbs 3x15   SL SLR x12 x20 x15 2.5 lbs   Step and hold onto air-ex x15 fwd  X15 lateral   Trigger Point Dry-Needling  Treatment instructions: Expect mild to moderate muscle soreness. S/S of pneumothorax if dry needled over a lung field, and to seek immediate medical attention should they occur. Patient verbalized understanding of these instructions and education.  Patient Consent Given: Yes Education handout provided: Yes Muscles treated:  left lateral quad 3x using a .30x50 needle  Electrical stimulation performed: No Parameters: N/A Treatment response/outcome: great twit   Last visit   SLR:  1st set x10 RPE of 3  2nd set x20 RPE of 5. We will likley have to load it next visit.   SL SLR  2x20   Bridging with band  X20 blue RPE of 6   Standing Slow March   2x15 RPE of 6   Trigger Point Dry-Needling  Treatment instructions: Expect mild to moderate muscle soreness. S/S of pneumothorax if dry needled over a lung field, and to seek immediate medical attention should they occur. Patient verbalized understanding of these instructions and education.  Patient Consent Given: Yes Education handout provided: Yes Muscles treated:  left lateral quad 3x using a .30x50 needle  Electrical stimulation performed: No Parameters: N/A Treatment response/outcome: great twitch         1st set    01/01/23 Squat 1 x 10  Hip abduction 1 x 10 Hip extension 1 x 10    PATIENT EDUCATION:  Education details: Patient educated on exam findings, POC, scope of PT, HEP. Person educated: Patient Education method: Explanation, Demonstration, and Handouts Education comprehension: verbalized understanding, returned  demonstration, verbal cues required, and tactile cues required  HOME EXERCISE PROGRAM: Access Code: [email or web address] URL: [email or web address]   Date: 01/01/2023 - Squat  - 1 x daily - 7 x weekly - 3 sets - 10 reps - Sidelying Hip Abduction  -  1 x daily - 7 x weekly - 3 sets - 10 reps - Prone Hip Extension  - 1 x daily - 7 x weekly - 3 sets - 10 reps  ASSESSMENT:  CLINICAL IMPRESSION: The patient continues to make progress. We worked him back into [employer] activity today. He was advised to keep his weight low and his reps higher. He had no increase in pain. We worked on eccentric loading as well. Therapy will continue to progress as tolerated    Eval:Patient a 42 y.o. y.o. male who was seen today for physical therapy evaluation and treatment for R femur fracture. Patient presents with pain limited deficits in RLE strength, ROM, endurance, activity tolerance, gait, balance, and functional mobility with ADL. Patient is having to modify and restrict ADL as indicated by outcome measure score as well as subjective information and objective measures which is affecting overall participation. Patient will benefit from skilled physical therapy in order to improve function and reduce impairment.  OBJECTIVE IMPAIRMENTS: Abnormal gait, decreased activity tolerance, decreased balance, decreased endurance, decreased mobility, difficulty walking, decreased ROM, decreased strength, impaired flexibility, improper body mechanics, and pain.   ACTIVITY LIMITATIONS: carrying, lifting, bending, standing, squatting, stairs, transfers, locomotion level, and caring for others  PARTICIPATION LIMITATIONS: meal prep, cleaning, laundry, shopping, community activity, occupation, and yard work  PERSONAL FACTORS: Profession are also affecting patient's functional outcome.   REHAB POTENTIAL: Good  CLINICAL DECISION MAKING: Stable/uncomplicated  EVALUATION COMPLEXITY: Low   GOALS: Goals reviewed with  patient? Yes  SHORT TERM GOALS: Target date: 01/29/2023    Patient will be independent with HEP in order to improve functional outcomes. Baseline: Goal status: INITIAL  2.  Patient will report at least 25% improvement in symptoms for improved quality of life. Baseline: Goal status: INITIAL    LONG TERM GOALS: Target date: 02/26/2023    Patient will report at least 75% improvement in symptoms for improved quality of life. Baseline:  Goal status: INITIAL  2.  Patient will improve FOTO score to predicted outcomes  in order to indicate improved tolerance to activity. Baseline:  Goal status: INITIAL  3.  Patient will be able to navigate stairs with reciprocal pattern without compensation in order to demonstrate improved LE strength. Baseline:  Goal status: INITIAL  4.  Patient will be able to complete forward step down test without deviation for improved functional strength.  Baseline:  Goal status: INITIAL  5.  Patient will be able to return to all activities unrestricted for improved ability to perform work functions and participate with friends. Baseline:  Goal status: INITIAL  6.  Patient will demonstrate improved strength of at least 10 lbs in all tested musculature as evidence of improved strength to assist with pit crew duties. Baseline:  Goal status: INITIAL   PLAN:  PT FREQUENCY: 2x/week  PT DURATION: 8 weeks  PLANNED INTERVENTIONS: Therapeutic exercises, Therapeutic activity, Neuromuscular re-education, Balance training, Gait training, Patient/Family education, Joint manipulation, Joint mobilization, Stair training, Orthotic/Fit training, DME instructions, Aquatic Therapy, Dry Needling, Electrical stimulation, Spinal manipulation, Spinal mobilization, Cryotherapy, Moist heat, Compression bandaging, scar mobilization, Splintting, Taping, Traction, Ultrasound, Ionotophoresis 4mg /ml Dexamethasone, and Manual therapy  PLAN FOR NEXT SESSION: f/u with HEP, complete  FOTO. R quad and hip strength, begin with table/functional, progress to machines and higher level mobility as able   Dessie Coma, PT 01/22/2023, 10:23 AM

## 2023-01-26 ENCOUNTER — Ambulatory Visit (HOSPITAL_BASED_OUTPATIENT_CLINIC_OR_DEPARTMENT_OTHER): Payer: 59 | Admitting: Physical Therapy

## 2023-01-26 ENCOUNTER — Encounter (HOSPITAL_BASED_OUTPATIENT_CLINIC_OR_DEPARTMENT_OTHER): Payer: Self-pay | Admitting: Physical Therapy

## 2023-01-26 DIAGNOSIS — R2689 Other abnormalities of gait and mobility: Secondary | ICD-10-CM

## 2023-01-26 DIAGNOSIS — M6281 Muscle weakness (generalized): Secondary | ICD-10-CM

## 2023-01-26 DIAGNOSIS — S72141A Displaced intertrochanteric fracture of right femur, initial encounter for closed fracture: Secondary | ICD-10-CM

## 2023-01-26 DIAGNOSIS — M79604 Pain in right leg: Secondary | ICD-10-CM

## 2023-01-26 DIAGNOSIS — R29898 Other symptoms and signs involving the musculoskeletal system: Secondary | ICD-10-CM

## 2023-01-26 NOTE — Therapy (Signed)
OUTPATIENT PHYSICAL THERAPY LOWER EXTREMITY  Treatment   Patient Name: Chad Bray MRN: [id number] DOB:[birth year], 42 y.o., male Today's Date: 01/26/2023  END OF SESSION:  PT End of Session - 01/26/23 1110     Visit Number 5    Number of Visits 16    Date for PT Re-Evaluation 02/26/23    Authorization Type [employer]    PT Start Time 1105   Therapy late   PT Stop Time 1147    PT Time Calculation (min) 42 min    Activity Tolerance Patient tolerated treatment well    Behavior During Therapy WFL for tasks assessed/performed               Past Medical History:  Diagnosis Date   GERD (gastroesophageal reflux disease)    Stevens-Johnson syndrome (HCC)    lamictal   Past Surgical History:  Procedure Laterality Date   FEMUR IM NAIL Right 11/24/2022   Procedure: INTRAMEDULLARY (IM) NAIL FEMORAL;  Surgeon: Myrene Galas, MD;  Location: [facility] OR;  Service: Orthopedics;  Laterality: Right;   tibia and fibula surgery Left    2023   Patient Active Problem List   Diagnosis Date Noted   Closed intertrochanteric fracture of hip, right, initial encounter (HCC) 11/23/2022    PCP: No PCP  REFERRING PROVIDER: Myrene Galas, MD  REFERRING DIAG: S72.141A (ICD-10-CM) - Closed displaced intertrochanteric fracture of right femur, initial encounter (HCC)  THERAPY DIAG:  Pain in right leg  Muscle weakness (generalized)  Other abnormalities of gait and mobility  Other symptoms and signs involving the musculoskeletal system  Closed displaced intertrochanteric fracture of right femur, initial encounter (HCC)  Rationale for Evaluation and Treatment: Rehabilitation  ONSET DATE: DOS 11/24/22  SUBJECTIVE:   SUBJECTIVE STATEMENT: Just some soreness after the last visit. Overall he feels like he is doing well.    Eval:Patient states fell over fence breaking hip. Has been using one crutch to walk. Was using no crutch for day and was fatigued next day. 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 Hx left tib/fib  PERTINENT HISTORY: admitted on 11/23/2022 with comminuted displaced right peritrochanteric hip fracture. DOS 11/24/22 - IM femoral nail PAIN:  Are you having pain? No  PRECAUTIONS: None  WEIGHT BEARING RESTRICTIONS: No  FALLS:  Has patient fallen in last 6 months? Yes. 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  PLOF: Independent  PATIENT GOALS: improve walking, strength, mobility   OBJECTIVE: (objective measures from initial evaluation unless otherwise dated)   PATIENT SURVEYS: FOTO complete next session  COGNITION: Overall cognitive status: Within functional limits for tasks assessed     SENSATION: WFL   PALPATION: WFL medial/lateral patellar glides  LOWER EXTREMITY ROM:  Active ROM Right eval Left eval  Hip flexion    Hip extension    Hip abduction    Hip adduction    Hip internal rotation    Hip external rotation    Knee flexion 145   Knee extension 0   Ankle dorsiflexion    Ankle plantarflexion    Ankle inversion    Ankle eversion     (Blank rows = not tested) *= pain/symptoms  LOWER EXTREMITY MMT:  MMT Right eval Left eval  Hip flexion 56.2 49.9  Hip extension    Hip abduction 52.2 62.8  Hip adduction    Hip internal rotation    Hip external rotation    Knee flexion  Knee extension 44.3 99  Ankle dorsiflexion    Ankle plantarflexion    Ankle inversion    Ankle eversion     (Blank rows = not tested) *= pain/symptoms   FUNCTIONAL TESTS:  Squat: weight shift off RLE  Steps: able to complete 7 inch with alternating pattern, decreased concentric and eccentric R quad/glute strength and motor control    GAIT: Distance walked: 100 feet Assistive device utilized: Crutches Level of assistance: Modified independence Comments: ambulates with trendelenburg   TODAY'S TREATMENT:                                                                                                                               DATE:     10/7 Leg press cybex 3x12  lbs 120 lbs  Knee extension machine 3x20 30 lbs   TRX squat 3x10  Dead lift 2x10 24 lbs limited by cardio  Goblet squat 3x12 10 lbs   Air-ex fwd and lateral step x20     10/2 Ex bike: 6 min L5   SLR 2.5 lbs 3x10   Leg press LF 3x12 30 lbs  Hip abduction machine 70 lbs 3x10   Cable walk x10 20 lbs   Step down 2 inch controlled 3x10     9/30  SLR 2.5 lbs 3x10   LAQ 2 lbs 3x15   SL SLR x12 x20 x15 2.5 lbs   Step and hold onto air-ex x15 fwd  X15 lateral   Trigger Point Dry-Needling  Treatment instructions: Expect mild to moderate muscle soreness. S/S of pneumothorax if dry needled over a lung field, and to seek immediate medical attention should they occur. Patient verbalized understanding of these instructions and education.  Patient Consent Given: Yes Education handout provided: Yes Muscles treated:  left lateral quad 3x using a .30x50 needle  Electrical stimulation performed: No Parameters: N/A Treatment response/outcome: great twit   Last visit   SLR:  1st set x10 RPE of 3  2nd set x20 RPE of 5. We will likley have to load it next visit.   SL SLR  2x20   Bridging with band  X20 blue RPE of 6   Standing Slow March   2x15 RPE of 6   Trigger Point Dry-Needling  Treatment instructions: Expect mild to moderate muscle soreness. S/S of pneumothorax if dry needled over a lung field, and to seek immediate medical attention should they occur. Patient verbalized understanding of these instructions and education.  Patient Consent Given: Yes Education handout provided: Yes Muscles treated:  left lateral quad 3x using a .30x50 needle  Electrical stimulation performed: No Parameters: N/A Treatment response/outcome: great twitch         1st set    01/01/23 Squat 1 x 10  Hip abduction 1 x 10 Hip extension 1 x 10    PATIENT EDUCATION:  Education  details: Patient educated on exam findings, POC, scope of PT, HEP. Person educated: Patient Education  method: Explanation, Demonstration, and Handouts Education comprehension: verbalized understanding, returned demonstration, verbal cues required, and tactile cues required  HOME EXERCISE PROGRAM: Access Code: [email or web address] URL: [email or web address]   Date: 01/01/2023 - Squat  - 1 x daily - 7 x weekly - 3 sets - 10 reps - Sidelying Hip Abduction  - 1 x daily - 7 x weekly - 3 sets - 10 reps - Prone Hip Extension  - 1 x daily - 7 x weekly - 3 sets - 10 reps  ASSESSMENT:  CLINICAL IMPRESSION: Therapy was able to advance weights with lifting activity. We had him trial a few different lifts and discussed progression at the gym. He tolerated well. He had no pain in his leg but did have some cardio fatigue. We will continue to monitor his repsonse of his leg to loading activity. We continue to keep his RPE around 3/10. He has full ROM of his knee. See below for goal specific progress.   Eval:Patient a 42 y.o. y.o. male who was seen today for physical therapy evaluation and treatment for R femur fracture. Patient presents with pain limited deficits in RLE strength, ROM, endurance, activity tolerance, gait, balance, and functional mobility with ADL. Patient is having to modify and restrict ADL as indicated by outcome measure score as well as subjective information and objective measures which is affecting overall participation. Patient will benefit from skilled physical therapy in order to improve function and reduce impairment.  OBJECTIVE IMPAIRMENTS: Abnormal gait, decreased activity tolerance, decreased balance, decreased endurance, decreased mobility, difficulty walking, decreased ROM, decreased strength, impaired flexibility, improper body mechanics, and pain.   ACTIVITY LIMITATIONS: carrying, lifting, bending, standing, squatting, stairs, transfers, locomotion level, and caring for  others  PARTICIPATION LIMITATIONS: meal prep, cleaning, laundry, shopping, community activity, occupation, and yard work  PERSONAL FACTORS: Profession are also affecting patient's functional outcome.   REHAB POTENTIAL: Good  CLINICAL DECISION MAKING: Stable/uncomplicated  EVALUATION COMPLEXITY: Low   GOALS: Goals reviewed with patient? Yes  SHORT TERM GOALS: Target date: 01/29/2023    Patient will be independent with HEP in order to improve functional outcomes. Baseline: Goal status: INITIAL  2.  Patient will report at least 25% improvement in symptoms for improved quality of life. Baseline: Goal status: INITIAL    LONG TERM GOALS: Target date: 02/26/2023    Patient will report at least 75% improvement in symptoms for improved quality of life. Baseline:  Goal status: INITIAL  2.  Patient will improve FOTO score to predicted outcomes  in order to indicate improved tolerance to activity. Baseline:  Goal status: INITIAL  3.  Patient will be able to navigate stairs with reciprocal pattern without compensation in order to demonstrate improved LE strength. Baseline:  Goal status: INITIAL  4.  Patient will be able to complete forward step down test without deviation for improved functional strength.  Baseline:  Goal status: INITIAL  5.  Patient will be able to return to all activities unrestricted for improved ability to perform work functions and participate with friends. Baseline:  Goal status: INITIAL  6.  Patient will demonstrate improved strength of at least 10 lbs in all tested musculature as evidence of improved strength to assist with pit crew duties. Baseline:  Goal status: INITIAL   PLAN:  PT FREQUENCY: 2x/week  PT DURATION: 8 weeks  PLANNED INTERVENTIONS: Therapeutic exercises, Therapeutic activity, Neuromuscular re-education, Balance training, Gait training, Patient/Family education, Joint manipulation, Joint mobilization, Stair training,  Orthotic/Fit training, DME instructions, Aquatic Therapy, Dry Needling, Electrical stimulation,  Spinal manipulation, Spinal mobilization, Cryotherapy, Moist heat, Compression bandaging, scar mobilization, Splintting, Taping, Traction, Ultrasound, Ionotophoresis 4mg /ml Dexamethasone, and Manual therapy  PLAN FOR NEXT SESSION: f/u with HEP, complete FOTO. R quad and hip strength, begin with table/functional, progress to machines and higher level mobility as able   Dessie Coma, PT 01/26/2023, 11:15 AM

## 2023-01-28 ENCOUNTER — Encounter (HOSPITAL_BASED_OUTPATIENT_CLINIC_OR_DEPARTMENT_OTHER): Payer: Self-pay | Admitting: Physical Therapy

## 2023-01-28 ENCOUNTER — Ambulatory Visit (HOSPITAL_BASED_OUTPATIENT_CLINIC_OR_DEPARTMENT_OTHER): Payer: 59 | Admitting: Physical Therapy

## 2023-01-28 DIAGNOSIS — M79604 Pain in right leg: Secondary | ICD-10-CM

## 2023-01-28 DIAGNOSIS — M6281 Muscle weakness (generalized): Secondary | ICD-10-CM

## 2023-01-28 DIAGNOSIS — R2689 Other abnormalities of gait and mobility: Secondary | ICD-10-CM

## 2023-01-28 DIAGNOSIS — R29898 Other symptoms and signs involving the musculoskeletal system: Secondary | ICD-10-CM

## 2023-01-28 DIAGNOSIS — S72141A Displaced intertrochanteric fracture of right femur, initial encounter for closed fracture: Secondary | ICD-10-CM

## 2023-01-28 NOTE — Therapy (Signed)
OUTPATIENT PHYSICAL THERAPY LOWER EXTREMITY  Treatment   Patient Name: Chad Bray MRN: [id number] DOB:[birth year], 42 y.o., male Today's Date: 01/28/2023  END OF SESSION:  PT End of Session - 01/28/23 1101     Visit Number 6    Number of Visits 16    Date for PT Re-Evaluation 02/26/23    Authorization Type [employer]    PT Start Time 1100    PT Stop Time 1138    PT Time Calculation (min) 38 min    Activity Tolerance Patient tolerated treatment well    Behavior During Therapy WFL for tasks assessed/performed               Past Medical History:  Diagnosis Date   GERD (gastroesophageal reflux disease)    Stevens-Johnson syndrome (HCC)    lamictal   Past Surgical History:  Procedure Laterality Date   FEMUR IM NAIL Right 11/24/2022   Procedure: INTRAMEDULLARY (IM) NAIL FEMORAL;  Surgeon: Myrene Galas, MD;  Location: [facility] OR;  Service: Orthopedics;  Laterality: Right;   tibia and fibula surgery Left    2023   Patient Active Problem List   Diagnosis Date Noted   Closed intertrochanteric fracture of hip, right, initial encounter (HCC) 11/23/2022    PCP: No PCP  REFERRING PROVIDER: Myrene Galas, MD  REFERRING DIAG: S72.141A (ICD-10-CM) - Closed displaced intertrochanteric fracture of right femur, initial encounter (HCC)  THERAPY DIAG:  Pain in right leg  Muscle weakness (generalized)  Other abnormalities of gait and mobility  Other symptoms and signs involving the musculoskeletal system  Closed displaced intertrochanteric fracture of right femur, initial encounter [facility])  Rationale for Evaluation and Treatment: Rehabilitation  ONSET DATE: DOS 11/24/22  SUBJECTIVE:   SUBJECTIVE STATEMENT: Really sore after last session. Doctor is going to release him today when he sees him. Feels like he is pretty close to being done. HEP going well. Been doing some exercise at the fire station. Trying to get in good enough shape to go work in [employer] some.  Patient states 90-95% improvement in symptoms/function. Feels that he needs to continue with leg strengthening.    Eval:Patient states fell over fence breaking hip. Has been using one crutch to walk. Was using no crutch for day and was fatigued next day. 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 Hx left tib/fib  PERTINENT HISTORY: admitted on 11/23/2022 with comminuted displaced right peritrochanteric hip fracture. DOS 11/24/22 - IM femoral nail PAIN:  Are you having pain? No  PRECAUTIONS: None  WEIGHT BEARING RESTRICTIONS: No  FALLS:  Has patient fallen in last 6 months? Yes. 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  PLOF: Independent  PATIENT GOALS: improve walking, strength, mobility   OBJECTIVE: (objective measures from initial evaluation unless otherwise dated)   PATIENT SURVEYS: FOTO complete next session  COGNITION: Overall cognitive status: Within functional limits for tasks assessed     SENSATION: WFL   PALPATION: WFL medial/lateral patellar glides  LOWER EXTREMITY ROM:  Active ROM Right eval Left eval  Hip flexion    Hip extension    Hip abduction    Hip adduction    Hip internal rotation    Hip external rotation    Knee flexion 145   Knee extension 0   Ankle dorsiflexion    Ankle plantarflexion    Ankle inversion    Ankle eversion     (Blank rows = not tested) *=  pain/symptoms  LOWER EXTREMITY MMT:  MMT Right eval Left eval Right 01/28/23 Left 01/28/23  Hip flexion 56.2 49.9 65.9 81  Hip extension      Hip abduction 52.2 62.8 68.8 77  Hip adduction      Hip internal rotation      Hip external rotation      Knee flexion      Knee extension 44.3 99 88.1 111.4  Ankle dorsiflexion      Ankle plantarflexion      Ankle inversion      Ankle eversion       (Blank rows = not tested) *= pain/symptoms   FUNCTIONAL TESTS:  Squat: weight shift off RLE  Steps: able to complete 7  inch with alternating pattern, decreased concentric and eccentric R quad/glute strength and motor control    GAIT: Distance walked: 100 feet Assistive device utilized: Crutches Level of assistance: Modified independence Comments: ambulates with trendelenburg  Reassessment FUNCTIONAL TESTS:  Squat: slight weight shift off RLE at end range flexion at hip Steps: able to complete 7 inch with alternating pattern, decreased concentric and eccentric R quad/glute strength and motor control, continued trendelenberg Forward step down test: able to complete on L, unable on R due to weakness  TODAY'S TREATMENT:                                                                                                                              DATE:  01/28/23 Reassessment and education Mini squat slide outs 1 x 5 bilateral  10/7 Leg press cybex 3x12  lbs 120 lbs  Knee extension machine 3x20 30 lbs   TRX squat 3x10  Dead lift 2x10 24 lbs limited by cardio  Goblet squat 3x12 10 lbs   Air-ex fwd and lateral step x20     10/2 Ex bike: 6 min L5   SLR 2.5 lbs 3x10   Leg press LF 3x12 30 lbs  Hip abduction machine 70 lbs 3x10   Cable walk x10 20 lbs   Step down 2 inch controlled 3x10     9/30  SLR 2.5 lbs 3x10   LAQ 2 lbs 3x15   SL SLR x12 x20 x15 2.5 lbs   Step and hold onto air-ex x15 fwd  X15 lateral   Trigger Point Dry-Needling  Treatment instructions: Expect mild to moderate muscle soreness. S/S of pneumothorax if dry needled over a lung field, and to seek immediate medical attention should they occur. Patient verbalized understanding of these instructions and education.  Patient Consent Given: Yes Education handout provided: Yes Muscles treated:  left lateral quad 3x using a .30x50 needle  Electrical stimulation performed: No Parameters: N/A Treatment response/outcome: great twit   Last visit   SLR:  1st set x10 RPE of 3  2nd set x20 RPE of 5. We will likley have to  load it next visit.   SL SLR  2x20   Bridging with band  X20 blue RPE of 6   Standing Slow March   2x15 RPE of 6   Trigger Point Dry-Needling  Treatment instructions: Expect mild to moderate muscle soreness. S/S of pneumothorax if dry needled over a lung field, and to seek immediate medical attention should they occur. Patient verbalized understanding of these instructions and education.  Patient Consent Given: Yes Education handout provided: Yes Muscles treated:  left lateral quad 3x using a .30x50 needle  Electrical stimulation performed: No Parameters: N/A Treatment response/outcome: great twitch         1st set    01/01/23 Squat 1 x 10  Hip abduction 1 x 10 Hip extension 1 x 10    PATIENT EDUCATION:  Education details: Patient educated on exam findings, POC, scope of PT, HEP. 01/28/23: HEP, reassessment findings, POC, nutrition Person educated: Patient Education method: Explanation, Demonstration, and Handouts Education comprehension: verbalized understanding, returned demonstration, verbal cues required, and tactile cues required  HOME EXERCISE PROGRAM: Access Code: [email or web address] URL: [email or web address]   Date: 01/01/2023 - Squat  - 1 x daily - 7 x weekly - 3 sets - 10 reps - Sidelying Hip Abduction  - 1 x daily - 7 x weekly - 3 sets - 10 reps - Prone Hip Extension  - 1 x daily - 7 x weekly - 3 sets - 10 reps 10/9 - Single Leg Balance with Four Way Reach and Rotation (Mirrored)  - 1 x daily - 7 x weekly - 2 sets - 5 reps  ASSESSMENT:  CLINICAL IMPRESSION: Patient has met 2/2 short term goals and 1/6 long term goals with ability to complete HEP and improvement in symptoms, strength, and functional mobility. Remaining goals not met due to continued deficits in strength, motor control, gait, activity tolerance and functional mobility. Patient has made good progress toward remaining goal and greatest limitation is in strength at this point but  progressing well. Will continue to progress strength as able. Patient will continue to benefit from skilled physical therapy in order to improve function and reduce impairment.    Eval:Patient a 42 y.o. y.o. male who was seen today for physical therapy evaluation and treatment for R femur fracture. Patient presents with pain limited deficits in RLE strength, ROM, endurance, activity tolerance, gait, balance, and functional mobility with ADL. Patient is having to modify and restrict ADL as indicated by outcome measure score as well as subjective information and objective measures which is affecting overall participation. Patient will benefit from skilled physical therapy in order to improve function and reduce impairment.  OBJECTIVE IMPAIRMENTS: Abnormal gait, decreased activity tolerance, decreased balance, decreased endurance, decreased mobility, difficulty walking, decreased ROM, decreased strength, impaired flexibility, improper body mechanics, and pain.   ACTIVITY LIMITATIONS: carrying, lifting, bending, standing, squatting, stairs, transfers, locomotion level, and caring for others  PARTICIPATION LIMITATIONS: meal prep, cleaning, laundry, shopping, community activity, occupation, and yard work  PERSONAL FACTORS: Profession are also affecting patient's functional outcome.   REHAB POTENTIAL: Good  CLINICAL DECISION MAKING: Stable/uncomplicated  EVALUATION COMPLEXITY: Low   GOALS: Goals reviewed with patient? Yes  SHORT TERM GOALS: Target date: 01/29/2023    Patient will be independent with HEP in order to improve functional outcomes. Baseline: Goal status: MET  2.  Patient will report at least 25% improvement in symptoms for improved quality of life. Baseline: Goal status: MET    LONG TERM GOALS: Target date: 02/26/2023    Patient will report at least 75% improvement in symptoms for improved quality of life. Baseline:  Goal status: MET  2.  Patient will improve FOTO score  to predicted outcomes  in order to indicate improved tolerance to activity. Baseline:  Goal status: INITIAL  3.  Patient will be able to navigate stairs with reciprocal pattern without compensation in order to demonstrate improved LE strength. Baseline:  Goal status: INITIAL  4.  Patient will be able to complete forward step down test without deviation for improved functional strength.  Baseline:  Goal status: INITIAL  5.  Patient will be able to return to all activities unrestricted for improved ability to perform work functions and participate with friends. Baseline:  Goal status: INITIAL  6.  Patient will demonstrate improved strength of at least 10 lbs in all tested musculature as evidence of improved strength to assist with pit crew duties. Baseline:  Goal status: INITIAL   PLAN:  PT FREQUENCY: 2x/week  PT DURATION: 8 weeks  PLANNED INTERVENTIONS: Therapeutic exercises, Therapeutic activity, Neuromuscular re-education, Balance training, Gait training, Patient/Family education, Joint manipulation, Joint mobilization, Stair training, Orthotic/Fit training, DME instructions, Aquatic Therapy, Dry Needling, Electrical stimulation, Spinal manipulation, Spinal mobilization, Cryotherapy, Moist heat, Compression bandaging, scar mobilization, Splintting, Taping, Traction, Ultrasound, Ionotophoresis 4mg /ml Dexamethasone, and Manual therapy  PLAN FOR NEXT SESSION: f/u with HEP, complete FOTO. R quad and hip strength, begin with table/functional, progress to machines and higher level mobility as able   Wyman Songster, PT 01/28/2023, 11:43 AM

## 2023-02-02 ENCOUNTER — Ambulatory Visit (HOSPITAL_BASED_OUTPATIENT_CLINIC_OR_DEPARTMENT_OTHER): Payer: 59 | Admitting: Physical Therapy

## 2023-02-02 ENCOUNTER — Encounter (HOSPITAL_BASED_OUTPATIENT_CLINIC_OR_DEPARTMENT_OTHER): Payer: Self-pay | Admitting: Physical Therapy

## 2023-02-02 DIAGNOSIS — R2689 Other abnormalities of gait and mobility: Secondary | ICD-10-CM

## 2023-02-02 DIAGNOSIS — M6281 Muscle weakness (generalized): Secondary | ICD-10-CM

## 2023-02-02 DIAGNOSIS — M79604 Pain in right leg: Secondary | ICD-10-CM

## 2023-02-02 NOTE — Therapy (Signed)
OUTPATIENT PHYSICAL THERAPY LOWER EXTREMITY  Treatment   Patient Name: Chad Bray MRN: [id number] DOB:[birth year], 42 y.o., male Today's Date: 02/02/2023  END OF SESSION:  PT End of Session - 02/02/23 1151     Visit Number 7    Number of Visits 16    Date for PT Re-Evaluation 02/26/23    Authorization Type [employer]    PT Start Time 1145    PT Stop Time 1228    PT Time Calculation (min) 43 min    Activity Tolerance Patient tolerated treatment well    Behavior During Therapy WFL for tasks assessed/performed               Past Medical History:  Diagnosis Date   GERD (gastroesophageal reflux disease)    Stevens-Johnson syndrome (HCC)    lamictal   Past Surgical History:  Procedure Laterality Date   FEMUR IM NAIL Right 11/24/2022   Procedure: INTRAMEDULLARY (IM) NAIL FEMORAL;  Surgeon: Myrene Galas, MD;  Location: [facility] OR;  Service: Orthopedics;  Laterality: Right;   tibia and fibula surgery Left    2023   Patient Active Problem List   Diagnosis Date Noted   Closed intertrochanteric fracture of hip, right, initial encounter (HCC) 11/23/2022    PCP: No PCP  REFERRING PROVIDER: Myrene Galas, MD  REFERRING DIAG: S72.141A (ICD-10-CM) - Closed displaced intertrochanteric fracture of right femur, initial encounter (HCC)  THERAPY DIAG:  Pain in right leg  Muscle weakness (generalized)  Other abnormalities of gait and mobility  Rationale for Evaluation and Treatment: Rehabilitation  ONSET DATE: DOS 11/24/22 Days since surgery: 70  SUBJECTIVE:   SUBJECTIVE STATEMENT: The patien treports he is doing well. He flet good after the last session. He reduced to 1x a week and wants to work hard on that one visit.  Eval:Patient states fell over fence breaking hip. Has been using one crutch to walk. Was using no crutch for day and was fatigued next day. 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 Hx left tib/fib  PERTINENT  HISTORY: admitted on 11/23/2022 with comminuted displaced right peritrochanteric hip fracture. DOS 11/24/22 - IM femoral nail PAIN:  Are you having pain? No  PRECAUTIONS: None  WEIGHT BEARING RESTRICTIONS: No  FALLS:  Has patient fallen in last 6 months? Yes. 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  PLOF: Independent  PATIENT GOALS: improve walking, strength, mobility   OBJECTIVE: (objective measures from initial evaluation unless otherwise dated)   PATIENT SURVEYS: FOTO complete next session  COGNITION: Overall cognitive status: Within functional limits for tasks assessed     SENSATION: WFL   PALPATION: WFL medial/lateral patellar glides  LOWER EXTREMITY ROM:  Active ROM Right eval Left eval  Hip flexion    Hip extension    Hip abduction    Hip adduction    Hip internal rotation    Hip external rotation    Knee flexion 145   Knee extension 0   Ankle dorsiflexion    Ankle plantarflexion    Ankle inversion    Ankle eversion     (Blank rows = not tested) *= pain/symptoms  LOWER EXTREMITY MMT:  MMT Right eval Left eval Right 01/28/23 Left 01/28/23  Hip flexion 56.2 49.9 65.9 81  Hip extension      Hip abduction 52.2 62.8 68.8 77  Hip adduction      Hip internal rotation      Hip external  rotation      Knee flexion      Knee extension 44.3 99 88.1 111.4  Ankle dorsiflexion      Ankle plantarflexion      Ankle inversion      Ankle eversion       (Blank rows = not tested) *= pain/symptoms   FUNCTIONAL TESTS:  Squat: weight shift off RLE  Steps: able to complete 7 inch with alternating pattern, decreased concentric and eccentric R quad/glute strength and motor control    GAIT: Distance walked: 100 feet Assistive device utilized: Crutches Level of assistance: Modified independence Comments: ambulates with trendelenburg  Reassessment FUNCTIONAL TESTS:  Squat: slight weight shift off RLE at end range flexion  at hip Steps: able to complete 7 inch with alternating pattern, decreased concentric and eccentric R quad/glute strength and motor control, continued trendelenberg Forward step down test: able to complete on L, unable on R due to weakness  TODAY'S TREATMENT:                                                                                                                              DATE:  10/14 Leg press LF 70 2x 90 1x  Goblet squat 3x12 15 lbs   Cable walk x10 fwd and reverse 25 lbs   SLR 3x15 2lbs  SL SLR 3x15 2 lbs  Hi extension 3x15 2 lbs   01/28/23 Reassessment and education Mini squat slide outs 1 x 5 bilateral  10/7 Leg press cybex 3x12  lbs 120 lbs  Knee extension machine 3x20 30 lbs   TRX squat 3x10  Dead lift 2x10 24 lbs limited by cardio  Goblet squat 3x12 10 lbs   Air-ex fwd and lateral step x20       PATIENT EDUCATION:  Education details: Patient educated on exam findings, POC, scope of PT, HEP. 01/28/23: HEP, reassessment findings, POC, nutrition Person educated: Patient Education method: Explanation, Demonstration, and Handouts Education comprehension: verbalized understanding, returned demonstration, verbal cues required, and tactile cues required  HOME EXERCISE PROGRAM: Access Code: [email or web address] URL: [email or web address]   Date: 01/01/2023 - Squat  - 1 x daily - 7 x weekly - 3 sets - 10 reps - Sidelying Hip Abduction  - 1 x daily - 7 x weekly - 3 sets - 10 reps - Prone Hip Extension  - 1 x daily - 7 x weekly - 3 sets - 10 reps 10/9 - Single Leg Balance with Four Way Reach and Rotation (Mirrored)  - 1 x daily - 7 x weekly - 2 sets - 5 reps  ASSESSMENT:  CLINICAL IMPRESSION: The patient continues to make great progress. He has had mild anterior hip popping from time to time. He was shown a Energy manager. He has been progressing his weight as he goes along. He had no significant increase in pain with exercises today. We will continue to  progress as tolerated.   Eval:Patient a 42 y.o. y.o. male  who was seen today for physical therapy evaluation and treatment for R femur fracture. Patient presents with pain limited deficits in RLE strength, ROM, endurance, activity tolerance, gait, balance, and functional mobility with ADL. Patient is having to modify and restrict ADL as indicated by outcome measure score as well as subjective information and objective measures which is affecting overall participation. Patient will benefit from skilled physical therapy in order to improve function and reduce impairment.  OBJECTIVE IMPAIRMENTS: Abnormal gait, decreased activity tolerance, decreased balance, decreased endurance, decreased mobility, difficulty walking, decreased ROM, decreased strength, impaired flexibility, improper body mechanics, and pain.   ACTIVITY LIMITATIONS: carrying, lifting, bending, standing, squatting, stairs, transfers, locomotion level, and caring for others  PARTICIPATION LIMITATIONS: meal prep, cleaning, laundry, shopping, community activity, occupation, and yard work  PERSONAL FACTORS: Profession are also affecting patient's functional outcome.   REHAB POTENTIAL: Good  CLINICAL DECISION MAKING: Stable/uncomplicated  EVALUATION COMPLEXITY: Low   GOALS: Goals reviewed with patient? Yes  SHORT TERM GOALS: Target date: 01/29/2023    Patient will be independent with HEP in order to improve functional outcomes. Baseline: Goal status: MET  2.  Patient will report at least 25% improvement in symptoms for improved quality of life. Baseline: Goal status: MET    LONG TERM GOALS: Target date: 02/26/2023    Patient will report at least 75% improvement in symptoms for improved quality of life. Baseline:  Goal status: MET  2.  Patient will improve FOTO score to predicted outcomes  in order to indicate improved tolerance to activity. Baseline:  Goal status: INITIAL  3.  Patient will be able to navigate  stairs with reciprocal pattern without compensation in order to demonstrate improved LE strength. Baseline:  Goal status: INITIAL  4.  Patient will be able to complete forward step down test without deviation for improved functional strength.  Baseline:  Goal status: INITIAL  5.  Patient will be able to return to all activities unrestricted for improved ability to perform work functions and participate with friends. Baseline:  Goal status: INITIAL  6.  Patient will demonstrate improved strength of at least 10 lbs in all tested musculature as evidence of improved strength to assist with pit crew duties. Baseline:  Goal status: INITIAL   PLAN:  PT FREQUENCY: 2x/week  PT DURATION: 8 weeks  PLANNED INTERVENTIONS: Therapeutic exercises, Therapeutic activity, Neuromuscular re-education, Balance training, Gait training, Patient/Family education, Joint manipulation, Joint mobilization, Stair training, Orthotic/Fit training, DME instructions, Aquatic Therapy, Dry Needling, Electrical stimulation, Spinal manipulation, Spinal mobilization, Cryotherapy, Moist heat, Compression bandaging, scar mobilization, Splintting, Taping, Traction, Ultrasound, Ionotophoresis 4mg /ml Dexamethasone, and Manual therapy  PLAN FOR NEXT SESSION: f/u with HEP, complete FOTO. R quad and hip strength, begin with table/functional, progress to machines and higher level mobility as able   Dessie Coma, PT 02/02/2023, 11:54 AM

## 2023-02-05 ENCOUNTER — Encounter (HOSPITAL_BASED_OUTPATIENT_CLINIC_OR_DEPARTMENT_OTHER): Payer: 59 | Admitting: Physical Therapy

## 2023-02-10 ENCOUNTER — Encounter (HOSPITAL_BASED_OUTPATIENT_CLINIC_OR_DEPARTMENT_OTHER): Payer: Self-pay

## 2023-02-10 ENCOUNTER — Ambulatory Visit (HOSPITAL_BASED_OUTPATIENT_CLINIC_OR_DEPARTMENT_OTHER): Payer: 59

## 2023-02-10 DIAGNOSIS — M79604 Pain in right leg: Secondary | ICD-10-CM

## 2023-02-10 DIAGNOSIS — S72141A Displaced intertrochanteric fracture of right femur, initial encounter for closed fracture: Secondary | ICD-10-CM

## 2023-02-10 DIAGNOSIS — R2689 Other abnormalities of gait and mobility: Secondary | ICD-10-CM

## 2023-02-10 DIAGNOSIS — R29898 Other symptoms and signs involving the musculoskeletal system: Secondary | ICD-10-CM

## 2023-02-10 DIAGNOSIS — M6281 Muscle weakness (generalized): Secondary | ICD-10-CM

## 2023-02-10 NOTE — Therapy (Signed)
OUTPATIENT PHYSICAL THERAPY LOWER EXTREMITY  Treatment   Patient Name: Chad Bray MRN: [id number] DOB:1981/03/21, 42 y.o., male Today's Date: 02/10/2023  END OF SESSION:  PT End of Session - 02/10/23 1209     Visit Number 8    Number of Visits 16    Date for PT Re-Evaluation 02/26/23    Authorization Type [employer]    PT Start Time 1101    PT Stop Time 1148    PT Time Calculation (min) 47 min    Activity Tolerance Patient tolerated treatment well    Behavior During Therapy WFL for tasks assessed/performed                Past Medical History:  Diagnosis Date   GERD (gastroesophageal reflux disease)    Stevens-Johnson syndrome (HCC)    lamictal   Past Surgical History:  Procedure Laterality Date   FEMUR IM NAIL Right 11/24/2022   Procedure: INTRAMEDULLARY (IM) NAIL FEMORAL;  Surgeon: Myrene Galas, MD;  Location: [facility] OR;  Service: Orthopedics;  Laterality: Right;   tibia and fibula surgery Left    2023   Patient Active Problem List   Diagnosis Date Noted   Closed intertrochanteric fracture of hip, right, initial encounter (HCC) 11/23/2022    PCP: No PCP  REFERRING PROVIDER: Myrene Galas, MD  REFERRING DIAG: S72.141A (ICD-10-CM) - Closed displaced intertrochanteric fracture of right femur, initial encounter (HCC)  THERAPY DIAG:  Pain in right leg  Muscle weakness (generalized)  Other abnormalities of gait and mobility  Other symptoms and signs involving the musculoskeletal system  Closed displaced intertrochanteric fracture of right femur, initial encounter (HCC)  Rationale for Evaluation and Treatment: Rehabilitation  ONSET DATE: DOS 11/24/22 Days since surgery: 78  SUBJECTIVE:   SUBJECTIVE STATEMENT: Pt had some soreness after cutting some trees this weekend. No pain at entry, only soreness.    Eval:Patient states fell over fence breaking hip. Has been using one crutch to walk. Was using no crutch for day and was fatigued next day.  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 Hx left tib/fib  PERTINENT HISTORY: admitted on 11/23/2022 with comminuted displaced right peritrochanteric hip fracture. DOS 11/24/22 - IM femoral nail PAIN:  Are you having pain? No  PRECAUTIONS: None  WEIGHT BEARING RESTRICTIONS: No  FALLS:  Has patient fallen in last 6 months? Yes. 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  PLOF: Independent  PATIENT GOALS: improve walking, strength, mobility   OBJECTIVE: (objective measures from initial evaluation unless otherwise dated)   PATIENT SURVEYS: FOTO complete next session  COGNITION: Overall cognitive status: Within functional limits for tasks assessed     SENSATION: WFL   PALPATION: WFL medial/lateral patellar glides  LOWER EXTREMITY ROM:  Active ROM Right eval Left eval  Hip flexion    Hip extension    Hip abduction    Hip adduction    Hip internal rotation    Hip external rotation    Knee flexion 145   Knee extension 0   Ankle dorsiflexion    Ankle plantarflexion    Ankle inversion    Ankle eversion     (Blank rows = not tested) *= pain/symptoms  LOWER EXTREMITY MMT:  MMT Right eval Left eval Right 01/28/23 Left 01/28/23  Hip flexion 56.2 49.9 65.9 81  Hip extension      Hip abduction 52.2 62.8 68.8 77  Hip adduction  Hip internal rotation      Hip external rotation      Knee flexion      Knee extension 44.3 99 88.1 111.4  Ankle dorsiflexion      Ankle plantarflexion      Ankle inversion      Ankle eversion       (Blank rows = not tested) *= pain/symptoms   FUNCTIONAL TESTS:  Squat: weight shift off RLE  Steps: able to complete 7 inch with alternating pattern, decreased concentric and eccentric R quad/glute strength and motor control    GAIT: Distance walked: 100 feet Assistive device utilized: Crutches Level of assistance: Modified independence Comments: ambulates with  trendelenburg  Reassessment FUNCTIONAL TESTS:  Squat: slight weight shift off RLE at end range flexion at hip Steps: able to complete 7 inch with alternating pattern, decreased concentric and eccentric R quad/glute strength and motor control, continued trendelenberg Forward step down test: able to complete on L, unable on R due to weakness  TODAY'S TREATMENT:                                                                                                                              DATE:   10/22 Upright bike seat 24- L 10 Cable walk fwd  x5 30lbs Cable walk retro x5 30lbs  Eccentric sit to stand to elevated plinth (RLE posterior to L in staggered stance) x10   Jimmye Norman carry unilateral 25lb KB x1 lap each (difficult with KB in L hand) Deadlifts 25lb KB 2x10 (ces for form)  TRX squats x30 Lateral step up x10 R (cues for slower pace and glute activation) Long sit HSS 3x 30seconds Fig 4 stretch 30sec x3R  10/14 Leg press LF 70 2x 90 1x  Goblet squat 3x12 15 lbs   Cable walk x10 fwd and reverse 25 lbs   SLR 3x15 2lbs  SL SLR 3x15 2 lbs  Hi extension 3x15 2 lbs   01/28/23 Reassessment and education Mini squat slide outs 1 x 5 bilateral  10/7 Leg press cybex 3x12  lbs 120 lbs  Knee extension machine 3x20 30 lbs   TRX squat 3x10  Dead lift 2x10 24 lbs limited by cardio  Goblet squat 3x12 10 lbs   Air-ex fwd and lateral step x20       PATIENT EDUCATION:  Education details: Patient educated on exam findings, POC, scope of PT, HEP. 01/28/23: HEP, reassessment findings, POC, nutrition Person educated: Patient Education method: Explanation, Demonstration, and Handouts Education comprehension: verbalized understanding, returned demonstration, verbal cues required, and tactile cues required  HOME EXERCISE PROGRAM: Access Code: [email or web address] URL: [email or web address]   Date: 01/01/2023 - Squat  - 1 x daily - 7 x weekly - 3 sets - 10 reps - Sidelying Hip  Abduction  - 1 x daily - 7 x weekly - 3 sets - 10 reps - Prone Hip Extension  - 1 x daily - 7 x weekly - 3  sets - 10 reps 10/9 - Single Leg Balance with Four Way Reach and Rotation (Mirrored)  - 1 x daily - 7 x weekly - 2 sets - 5 reps  ASSESSMENT:  CLINICAL IMPRESSION: Pt continues to c/o tightness in L hipm especially after activity. Pt challenged by unilateral farmer carry when loaded on L side, requiring decreased pace to focus on muscle activation and hip extension. Dififculty with lateral step up to 6" height due to weakness in R hip. Trialed eccentric sit to stands today which he had to modify to staggered stance due to difficulty. With this, plinht also had to be elevated for additional modification. Pt lacks hip ER and benefited from seated fig 4 stretch. Instructed him to perform this throughout the day to improve his mobility. Pt will continue to benefit from skilled PT to address continued deficits.    Eval:Patient a 42 y.o. y.o. male who was seen today for physical therapy evaluation and treatment for R femur fracture. Patient presents with pain limited deficits in RLE strength, ROM, endurance, activity tolerance, gait, balance, and functional mobility with ADL. Patient is having to modify and restrict ADL as indicated by outcome measure score as well as subjective information and objective measures which is affecting overall participation. Patient will benefit from skilled physical therapy in order to improve function and reduce impairment.  OBJECTIVE IMPAIRMENTS: Abnormal gait, decreased activity tolerance, decreased balance, decreased endurance, decreased mobility, difficulty walking, decreased ROM, decreased strength, impaired flexibility, improper body mechanics, and pain.   ACTIVITY LIMITATIONS: carrying, lifting, bending, standing, squatting, stairs, transfers, locomotion level, and caring for others  PARTICIPATION LIMITATIONS: meal prep, cleaning, laundry, shopping, community  activity, occupation, and yard work  PERSONAL FACTORS: Profession are also affecting patient's functional outcome.   REHAB POTENTIAL: Good  CLINICAL DECISION MAKING: Stable/uncomplicated  EVALUATION COMPLEXITY: Low   GOALS: Goals reviewed with patient? Yes  SHORT TERM GOALS: Target date: 01/29/2023    Patient will be independent with HEP in order to improve functional outcomes. Baseline: Goal status: MET  2.  Patient will report at least 25% improvement in symptoms for improved quality of life. Baseline: Goal status: MET    LONG TERM GOALS: Target date: 02/26/2023    Patient will report at least 75% improvement in symptoms for improved quality of life. Baseline:  Goal status: MET  2.  Patient will improve FOTO score to predicted outcomes  in order to indicate improved tolerance to activity. Baseline:  Goal status: INITIAL  3.  Patient will be able to navigate stairs with reciprocal pattern without compensation in order to demonstrate improved LE strength. Baseline:  Goal status: INITIAL  4.  Patient will be able to complete forward step down test without deviation for improved functional strength.  Baseline:  Goal status: INITIAL  5.  Patient will be able to return to all activities unrestricted for improved ability to perform work functions and participate with friends. Baseline:  Goal status: INITIAL  6.  Patient will demonstrate improved strength of at least 10 lbs in all tested musculature as evidence of improved strength to assist with pit crew duties. Baseline:  Goal status: INITIAL   PLAN:  PT FREQUENCY: 2x/week  PT DURATION: 8 weeks  PLANNED INTERVENTIONS: Therapeutic exercises, Therapeutic activity, Neuromuscular re-education, Balance training, Gait training, Patient/Family education, Joint manipulation, Joint mobilization, Stair training, Orthotic/Fit training, DME instructions, Aquatic Therapy, Dry Needling, Electrical stimulation, Spinal  manipulation, Spinal mobilization, Cryotherapy, Moist heat, Compression bandaging, scar mobilization, Splintting, Taping, Traction, Ultrasound, Ionotophoresis 4mg /ml  Dexamethasone, and Manual therapy  PLAN FOR NEXT SESSION: f/u with HEP, complete FOTO. R quad and hip strength, begin with table/functional, progress to machines and higher level mobility as able   Lenore Manner, PTA 02/10/2023, 12:10 PM

## 2023-02-12 ENCOUNTER — Encounter (HOSPITAL_BASED_OUTPATIENT_CLINIC_OR_DEPARTMENT_OTHER): Payer: 59 | Admitting: Physical Therapy

## 2023-02-12 NOTE — Plan of Care (Signed)
[facility] Tonsillectomy/Adenoidectomy, Postoperative PEDS care plan entered in error.

## 2023-02-17 ENCOUNTER — Ambulatory Visit (HOSPITAL_BASED_OUTPATIENT_CLINIC_OR_DEPARTMENT_OTHER): Payer: 59 | Admitting: Physical Therapy

## 2023-02-17 ENCOUNTER — Encounter (HOSPITAL_BASED_OUTPATIENT_CLINIC_OR_DEPARTMENT_OTHER): Payer: Self-pay | Admitting: Physical Therapy

## 2023-02-17 DIAGNOSIS — M79604 Pain in right leg: Secondary | ICD-10-CM | POA: Diagnosis not present

## 2023-02-17 DIAGNOSIS — R29898 Other symptoms and signs involving the musculoskeletal system: Secondary | ICD-10-CM

## 2023-02-17 DIAGNOSIS — M6281 Muscle weakness (generalized): Secondary | ICD-10-CM

## 2023-02-17 DIAGNOSIS — R2689 Other abnormalities of gait and mobility: Secondary | ICD-10-CM

## 2023-02-17 DIAGNOSIS — S72141A Displaced intertrochanteric fracture of right femur, initial encounter for closed fracture: Secondary | ICD-10-CM

## 2023-02-17 NOTE — Therapy (Signed)
OUTPATIENT PHYSICAL THERAPY LOWER EXTREMITY  Treatment   Patient Name: Chad Bray MRN: [id number] DOB:Oct 24, 1980, 42 y.o., male Today's Date: 02/17/2023  PHYSICAL THERAPY DISCHARGE SUMMARY  Visits from Start of Care: 9  Current functional level related to goals / functional outcomes: See below   Remaining deficits: See below   Education / Equipment: See below   Patient agrees to discharge. Patient goals were partially met. Patient is being discharged due to  transition to HEP and financial reasons.    END OF SESSION:  PT End of Session - 02/17/23 1053     Visit Number 9    Number of Visits 16    Date for PT Re-Evaluation 02/26/23    Authorization Type [employer]    PT Start Time 1055    PT Stop Time 1145    PT Time Calculation (min) 50 min    Activity Tolerance Patient tolerated treatment well    Behavior During Therapy WFL for tasks assessed/performed                Past Medical History:  Diagnosis Date   GERD (gastroesophageal reflux disease)    Stevens-Johnson syndrome (HCC)    lamictal   Past Surgical History:  Procedure Laterality Date   FEMUR IM NAIL Right 11/24/2022   Procedure: INTRAMEDULLARY (IM) NAIL FEMORAL;  Surgeon: Myrene Galas, MD;  Location: [facility] OR;  Service: Orthopedics;  Laterality: Right;   tibia and fibula surgery Left    2023   Patient Active Problem List   Diagnosis Date Noted   Closed intertrochanteric fracture of hip, right, initial encounter (HCC) 11/23/2022    PCP: No PCP  REFERRING PROVIDER: Myrene Galas, MD  REFERRING DIAG: S72.141A (ICD-10-CM) - Closed displaced intertrochanteric fracture of right femur, initial encounter (HCC)  THERAPY DIAG:  Pain in right leg  Muscle weakness (generalized)  Other abnormalities of gait and mobility  Other symptoms and signs involving the musculoskeletal system  Closed displaced intertrochanteric fracture of right femur, initial encounter (HCC)  Rationale for  Evaluation and Treatment: Rehabilitation  ONSET DATE: DOS 11/24/22 Days since surgery: 85  SUBJECTIVE:   SUBJECTIVE STATEMENT: Pt states good days and bad days but its doing alright. Theres portions that he still needs to work on. Feels 90% improvement with PT intervention, impaired strength and control   Eval:Patient states fell over fence breaking hip. Has been using one crutch to walk. Was using no crutch for day and was fatigued next day. 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 Hx left tib/fib  PERTINENT HISTORY: admitted on 11/23/2022 with comminuted displaced right peritrochanteric hip fracture. DOS 11/24/22 - IM femoral nail PAIN:  Are you having pain? No  PRECAUTIONS: None  WEIGHT BEARING RESTRICTIONS: No  FALLS:  Has patient fallen in last 6 months? Yes. 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  PLOF: Independent  PATIENT GOALS: improve walking, strength, mobility   OBJECTIVE: (objective measures from initial evaluation unless otherwise dated)   PATIENT SURVEYS: FOTO complete next session  COGNITION: Overall cognitive status: Within functional limits for tasks assessed     SENSATION: WFL   PALPATION: WFL medial/lateral patellar glides  LOWER EXTREMITY ROM:  Active ROM Right eval Left eval  Hip flexion    Hip extension    Hip abduction    Hip adduction    Hip internal rotation    Hip external rotation    Knee flexion 145  Knee extension 0   Ankle dorsiflexion    Ankle plantarflexion    Ankle inversion    Ankle eversion     (Blank rows = not tested) *= pain/symptoms  LOWER EXTREMITY MMT:  MMT Right eval Left eval Right 01/28/23 Left 01/28/23  Hip flexion 56.2 49.9 65.9 81  Hip extension      Hip abduction 52.2 62.8 68.8 77  Hip adduction      Hip internal rotation      Hip external rotation      Knee flexion      Knee extension 44.3 99 88.1 111.4  Ankle dorsiflexion       Ankle plantarflexion      Ankle inversion      Ankle eversion       (Blank rows = not tested) *= pain/symptoms   FUNCTIONAL TESTS:  Squat: weight shift off RLE  Steps: able to complete 7 inch with alternating pattern, decreased concentric and eccentric R quad/glute strength and motor control    GAIT: Distance walked: 100 feet Assistive device utilized: Crutches Level of assistance: Modified independence Comments: ambulates with trendelenburg  Reassessment FUNCTIONAL TESTS:  Squat: slight weight shift off RLE at end range flexion at hip Steps: able to complete 7 inch with alternating pattern, decreased concentric and eccentric R quad/glute strength and motor control, continued trendelenberg Forward step down test: able to complete on L, unable on R due to weakness   Reassessment 02/17/23 FUNCTIONAL TESTS:  Squat: good mechanics without cueing  Steps: able to complete 7 inch with alternating pattern, decreased concentric and eccentric R quad/glute strength and motor control, continued trendelenberg Forward step down test: able to complete on L, unable on R due to weakness   TODAY'S TREATMENT:                                                                                                                              DATE:  02/17/23 Upright bike seat 24- L 10 5 minutes Reassessment Gait mechanics, farmer carry with 5# in RUE vs L  10/22 Upright bike seat 24- L 10 Cable walk fwd  x5 30lbs Cable walk retro x5 30lbs  Eccentric sit to stand to elevated plinth (RLE posterior to L in staggered stance) x10   Jimmye Norman carry unilateral 25lb KB x1 lap each (difficult with KB in L hand) Deadlifts 25lb KB 2x10 (ces for form)  TRX squats x30 Lateral step up x10 R (cues for slower pace and glute activation) Long sit HSS 3x 30seconds Fig 4 stretch 30sec x3R  10/14 Leg press LF 70 2x 90 1x  Goblet squat 3x12 15 lbs   Cable walk x10 fwd and reverse 25 lbs   SLR 3x15 2lbs  SL SLR  3x15 2 lbs  Hi extension 3x15 2 lbs   01/28/23 Reassessment and education Mini squat slide outs 1 x 5 bilateral  10/7 Leg press cybex 3x12  lbs 120 lbs  Knee  extension machine 3x20 30 lbs   TRX squat 3x10  Dead lift 2x10 24 lbs limited by cardio  Goblet squat 3x12 10 lbs   Air-ex fwd and lateral step x20       PATIENT EDUCATION:  Education details: Patient educated on exam findings, POC, scope of PT, HEP. 01/28/23: HEP, reassessment findings, POC, nutrition 10/29: HEP, returning to PT if needed Person educated: Patient Education method: Explanation, Demonstration, and Handouts Education comprehension: verbalized understanding, returned demonstration, verbal cues required, and tactile cues required  HOME EXERCISE PROGRAM: Access Code: [email or web address] URL: [email or web address]   Date: 01/01/2023 - Squat  - 1 x daily - 7 x weekly - 3 sets - 10 reps - Sidelying Hip Abduction  - 1 x daily - 7 x weekly - 3 sets - 10 reps - Prone Hip Extension  - 1 x daily - 7 x weekly - 3 sets - 10 reps 10/9 - Single Leg Balance with Four Way Reach and Rotation (Mirrored)  - 1 x daily - 7 x weekly - 2 sets - 5 reps  ASSESSMENT:  CLINICAL IMPRESSION: Patient has met 2/2 short term goals and 2/6 long term goals with ability to complete HEP and improvement in symptoms, strength, and functional mobility. Remaining goals not met due to continued deficits in strength, motor control, gait, activity tolerance and functional mobility. Patient has made good progress toward remaining goal and greatest limitation is in strength at this point but progressing well. Session focused on gait mechanics and education following reassessment. Patient will transition to HEP due to financial constraints. Patient discharged from PT at this time.   Eval:Patient a 42 y.o. y.o. male who was seen today for physical therapy evaluation and treatment for R femur fracture. Patient presents with pain limited deficits in  RLE strength, ROM, endurance, activity tolerance, gait, balance, and functional mobility with ADL. Patient is having to modify and restrict ADL as indicated by outcome measure score as well as subjective information and objective measures which is affecting overall participation. Patient will benefit from skilled physical therapy in order to improve function and reduce impairment.  OBJECTIVE IMPAIRMENTS: Abnormal gait, decreased activity tolerance, decreased balance, decreased endurance, decreased mobility, difficulty walking, decreased ROM, decreased strength, impaired flexibility, improper body mechanics, and pain.   ACTIVITY LIMITATIONS: carrying, lifting, bending, standing, squatting, stairs, transfers, locomotion level, and caring for others  PARTICIPATION LIMITATIONS: meal prep, cleaning, laundry, shopping, community activity, occupation, and yard work  PERSONAL FACTORS: Profession are also affecting patient's functional outcome.   REHAB POTENTIAL: Good  CLINICAL DECISION MAKING: Stable/uncomplicated  EVALUATION COMPLEXITY: Low   GOALS: Goals reviewed with patient? Yes  SHORT TERM GOALS: Target date: 01/29/2023    Patient will be independent with HEP in order to improve functional outcomes. Baseline: Goal status: MET  2.  Patient will report at least 25% improvement in symptoms for improved quality of life. Baseline: Goal status: MET    LONG TERM GOALS: Target date: 02/26/2023    Patient will report at least 75% improvement in symptoms for improved quality of life. Baseline:  Goal status: MET  2.  Patient will improve FOTO score to predicted outcomes  in order to indicate improved tolerance to activity. Baseline:  Goal status: deferred  3.  Patient will be able to navigate stairs with reciprocal pattern without compensation in order to demonstrate improved LE strength. Baseline:  Goal status: NOT MET  4.  Patient will be able to complete forward step down test  without deviation for  improved functional strength.  Baseline:  Goal status: NOT MET  5.  Patient will be able to return to all activities unrestricted for improved ability to perform work functions and participate with friends. Baseline:  Goal status: NOT MET  6.  Patient will demonstrate improved strength of at least 10 lbs in all tested musculature as evidence of improved strength to assist with pit crew duties. Baseline:  Goal status: MET   PLAN:  PT FREQUENCY: 2x/week  PT DURATION: 8 weeks  PLANNED INTERVENTIONS: Therapeutic exercises, Therapeutic activity, Neuromuscular re-education, Balance training, Gait training, Patient/Family education, Joint manipulation, Joint mobilization, Stair training, Orthotic/Fit training, DME instructions, Aquatic Therapy, Dry Needling, Electrical stimulation, Spinal manipulation, Spinal mobilization, Cryotherapy, Moist heat, Compression bandaging, scar mobilization, Splintting, Taping, Traction, Ultrasound, Ionotophoresis 4mg /ml Dexamethasone, and Manual therapy  PLAN FOR NEXT SESSION: n/a  Reola Mosher Sophiya Morello, PT 02/17/2023, 11:48 AM

## 2023-02-19 ENCOUNTER — Encounter (HOSPITAL_BASED_OUTPATIENT_CLINIC_OR_DEPARTMENT_OTHER): Payer: 59 | Admitting: Physical Therapy

## 2023-02-24 ENCOUNTER — Encounter (HOSPITAL_BASED_OUTPATIENT_CLINIC_OR_DEPARTMENT_OTHER): Payer: 59 | Admitting: Physical Therapy

## 2023-02-26 ENCOUNTER — Encounter (HOSPITAL_BASED_OUTPATIENT_CLINIC_OR_DEPARTMENT_OTHER): Payer: 59 | Admitting: Physical Therapy

## 2023-03-03 ENCOUNTER — Encounter (HOSPITAL_BASED_OUTPATIENT_CLINIC_OR_DEPARTMENT_OTHER): Payer: 59 | Admitting: Physical Therapy

## 2023-03-05 ENCOUNTER — Encounter (HOSPITAL_BASED_OUTPATIENT_CLINIC_OR_DEPARTMENT_OTHER): Payer: 59 | Admitting: Physical Therapy

## 2023-03-10 ENCOUNTER — Encounter (HOSPITAL_BASED_OUTPATIENT_CLINIC_OR_DEPARTMENT_OTHER): Payer: 59 | Admitting: Physical Therapy

## 2023-03-12 ENCOUNTER — Encounter (HOSPITAL_BASED_OUTPATIENT_CLINIC_OR_DEPARTMENT_OTHER): Payer: 59 | Admitting: Physical Therapy

## 2023-03-16 ENCOUNTER — Encounter (HOSPITAL_BASED_OUTPATIENT_CLINIC_OR_DEPARTMENT_OTHER): Payer: 59

## 2023-03-18 ENCOUNTER — Encounter (HOSPITAL_BASED_OUTPATIENT_CLINIC_OR_DEPARTMENT_OTHER): Payer: 59 | Admitting: Physical Therapy
